# Patient Record
Sex: Female | Born: 1995 | Race: Black or African American | Hispanic: No | Marital: Single | Smoking: Never smoker
Health system: Southern US, Community
[De-identification: ages and names within clinical notes are randomized; demographics above are authoritative.]

## PROBLEM LIST (undated history)

## (undated) ENCOUNTER — Inpatient Hospital Stay (HOSPITAL_COMMUNITY): Payer: Self-pay

## (undated) DIAGNOSIS — Z789 Other specified health status: Secondary | ICD-10-CM

## (undated) DIAGNOSIS — Z8619 Personal history of other infectious and parasitic diseases: Secondary | ICD-10-CM

## (undated) DIAGNOSIS — F419 Anxiety disorder, unspecified: Secondary | ICD-10-CM

## (undated) HISTORY — DX: Personal history of other infectious and parasitic diseases: Z86.19

---

## 2017-07-22 ENCOUNTER — Encounter (HOSPITAL_COMMUNITY): Payer: Self-pay

## 2017-07-22 ENCOUNTER — Inpatient Hospital Stay (HOSPITAL_COMMUNITY): Payer: Self-pay

## 2017-07-22 ENCOUNTER — Inpatient Hospital Stay (HOSPITAL_COMMUNITY)
Admission: AD | Admit: 2017-07-22 | Discharge: 2017-07-22 | Disposition: A | Discharge status: Home / Self Care | Payer: Self-pay | Source: Ambulatory Visit | Attending: Obstetrics & Gynecology | Admitting: Obstetrics & Gynecology

## 2017-07-22 DIAGNOSIS — O00101 Right tubal pregnancy without intrauterine pregnancy: Secondary | ICD-10-CM | POA: Insufficient documentation

## 2017-07-22 DIAGNOSIS — O209 Hemorrhage in early pregnancy, unspecified: Secondary | ICD-10-CM | POA: Insufficient documentation

## 2017-07-22 DIAGNOSIS — Z3A00 Weeks of gestation of pregnancy not specified: Secondary | ICD-10-CM | POA: Insufficient documentation

## 2017-07-22 DIAGNOSIS — O00109 Unspecified tubal pregnancy without intrauterine pregnancy: Secondary | ICD-10-CM

## 2017-07-22 HISTORY — DX: Other specified health status: Z78.9

## 2017-07-22 LAB — COMPREHENSIVE METABOLIC PANEL
ALT: 13 U/L — ABNORMAL LOW (ref 14–54)
AST: 11 U/L — ABNORMAL LOW (ref 15–41)
Albumin: 4 g/dL (ref 3.5–5.0)
Alkaline Phosphatase: 54 U/L (ref 38–126)
Anion gap: 10 (ref 5–15)
BUN: 11 mg/dL (ref 6–20)
CO2: 23 mmol/L (ref 22–32)
Calcium: 9.5 mg/dL (ref 8.9–10.3)
Chloride: 102 mmol/L (ref 101–111)
Creatinine, Ser: 0.8 mg/dL (ref 0.44–1.00)
GFR calc Af Amer: >60 mL/min (ref >60)
GFR calc non Af Amer: >60 mL/min (ref >60)
Glucose, Bld: 89 mg/dL (ref 65–99)
Potassium: 3.8 mmol/L (ref 3.5–5.1)
Sodium: 135 mmol/L (ref 135–145)
Total Bilirubin: 0.7 mg/dL (ref 0.3–1.2)
Total Protein: 7.5 g/dL (ref 6.5–8.1)

## 2017-07-22 LAB — CBC
HEMATOCRIT: 35.2 % — AB (ref 36.0–46.0)
HEMOGLOBIN: 12.6 g/dL (ref 12.0–15.0)
MCH: 26.3 pg (ref 26.0–34.0)
MCHC: 35.8 g/dL (ref 30.0–36.0)
MCV: 73.3 fL — AB (ref 78.0–100.0)
PLATELETS: 512 10*3/uL — AB (ref 150–400)
RBC: 4.8 MIL/uL (ref 3.87–5.11)
RDW: 17.7 % — ABNORMAL HIGH (ref 11.5–15.5)
WBC: 6.4 10*3/uL (ref 4.0–10.5)

## 2017-07-22 LAB — URINALYSIS, ROUTINE W REFLEX MICROSCOPIC
Bacteria, UA: NONE SEEN
Bilirubin Urine: NEGATIVE
Glucose, UA: NEGATIVE mg/dL
Ketones, ur: NEGATIVE mg/dL
Leukocytes, UA: NEGATIVE
Nitrite: NEGATIVE
Protein, ur: NEGATIVE mg/dL
Specific Gravity, Urine: 1.02 (ref 1.005–1.030)
pH: 6 (ref 5.0–8.0)

## 2017-07-22 LAB — ABO/RH: ABO/RH(D): O POS

## 2017-07-22 LAB — POCT PREGNANCY, URINE: Preg Test, Ur: POSITIVE — AB

## 2017-07-22 LAB — HCG, QUANTITATIVE, PREGNANCY: hCG, Beta Chain, Quant, S: 1809 m[IU]/mL — ABNORMAL HIGH (ref <5)

## 2017-07-22 MED ORDER — METHOTREXATE INJECTION FOR WOMEN'S HOSPITAL
50.0000 mg/m2 | Freq: Once | INTRAMUSCULAR | Status: AC
  Administered 2017-07-22: 100 mg via INTRAMUSCULAR
  Filled 2017-07-22: qty 2

## 2017-07-22 NOTE — MAU Provider Note (Signed)
History     CSN: [id number]  Arrival date and time: 07/22/17 1252   First Provider Initiated Contact with Patient 07/22/17 1348      Chief Complaint  Patient presents with  . Vaginal Bleeding   HPI   [name] is a 22 y.o. female [id number] @ Unknown here in MAU with complaints of vaginal bleeding. States the bleeding started on Sunday. She was seen at the pregnancy care center today and had a pregnancy urine test that was positive. They did an [place] and told her that her baby in her uterus did not have a heartbeat and she needed to come to MAU for possible miscarriage.  States she was told that there was a baby however the baby did not have a HR.   OB History    Gravida Para Term Preterm AB Living   '2 1 1     1   ' SAB TAB Ectopic Multiple Live Births                  Past Medical History:  Diagnosis Date  . Medical history non-contributory     Past Surgical History:  Procedure Laterality Date  . CESAREAN SECTION      No family history on file.  Social History   Tobacco Use  . Smoking status: Never Smoker  . Smokeless tobacco: Never Used  Substance Use Topics  . Alcohol use: No    Frequency: Never  . Drug use: No    Allergies: Not on File  No medications prior to admission.   Results for orders placed or performed during the hospital encounter of 07/22/17 (from the past 48 hour(s))  Urinalysis, Routine w reflex microscopic     Status: Abnormal   Collection Time: 07/22/17  1:06 PM  Result Value Ref Range   Color, Urine YELLOW YELLOW   APPearance CLEAR CLEAR   Specific Gravity, Urine 1.020 1.005 - 1.030   pH 6.0 5.0 - 8.0   Glucose, UA NEGATIVE NEGATIVE mg/dL   Hgb urine dipstick LARGE (A) NEGATIVE   Bilirubin Urine NEGATIVE NEGATIVE   Ketones, ur NEGATIVE NEGATIVE mg/dL   Protein, ur NEGATIVE NEGATIVE mg/dL   Nitrite NEGATIVE NEGATIVE   Leukocytes, UA NEGATIVE NEGATIVE   RBC / HPF TOO NUMEROUS TO COUNT 0 - 5 RBC/hpf   WBC, UA 0-5 0 - 5 WBC/hpf    Bacteria, UA NONE SEEN NONE SEEN   Squamous Epithelial / LPF 0-5 (A) NONE SEEN   Mucus PRESENT   Pregnancy, urine POC     Status: Abnormal   Collection Time: 07/22/17  1:15 PM  Result Value Ref Range   Preg Test, Ur POSITIVE (A) NEGATIVE    Comment:        THE SENSITIVITY OF THIS METHODOLOGY IS >24 mIU/mL   CBC     Status: Abnormal   Collection Time: 07/22/17  1:46 PM  Result Value Ref Range   WBC 6.4 4.0 - 10.5 K/uL   RBC 4.80 3.87 - 5.11 MIL/uL   Hemoglobin 12.6 12.0 - 15.0 g/dL   HCT 35.2 (L) 36.0 - 46.0 %   MCV 73.3 (L) 78.0 - 100.0 fL   MCH 26.3 26.0 - 34.0 pg   MCHC 35.8 30.0 - 36.0 g/dL   RDW 17.7 (H) 11.5 - 15.5 %   Platelets 512 (H) 150 - 400 K/uL  ABO/Rh     Status: None   Collection Time: 07/22/17  1:46 PM  Result Value Ref Range  ABO/RH(D) O POS   hCG, quantitative, pregnancy     Status: Abnormal   Collection Time: 07/22/17  1:46 PM  Result Value Ref Range   hCG, Beta Chain, Quant, S 1,809 (H) <5 mIU/mL    Comment:          GEST. AGE      CONC.  (mIU/mL)   <=1 WEEK        5 - 50     2 WEEKS       50 - 500     3 WEEKS       100 - 10,000     4 WEEKS     1,000 - 30,000     5 WEEKS     3,500 - 115,000   6-8 WEEKS     12,000 - 270,000    12 WEEKS     15,000 - 220,000        FEMALE AND NON-PREGNANT FEMALE:     LESS THAN 5 mIU/mL   Comprehensive metabolic panel     Status: Abnormal   Collection Time: 07/22/17  1:46 PM  Result Value Ref Range   Sodium 135 135 - 145 mmol/L   Potassium 3.8 3.5 - 5.1 mmol/L   Chloride 102 101 - 111 mmol/L   CO2 23 22 - 32 mmol/L   Glucose, Bld 89 65 - 99 mg/dL   BUN 11 6 - 20 mg/dL   Creatinine, Ser 0.80 0.44 - 1.00 mg/dL   Calcium 9.5 8.9 - 10.3 mg/dL   Total Protein 7.5 6.5 - 8.1 g/dL   Albumin 4.0 3.5 - 5.0 g/dL   AST 11 (L) 15 - 41 U/L   ALT 13 (L) 14 - 54 U/L   Alkaline Phosphatase 54 38 - 126 U/L   Total Bilirubin 0.7 0.3 - 1.2 mg/dL   GFR calc non Af Amer >60 >60 mL/min   GFR calc Af Amer >60 >60 mL/min     Comment: (NOTE) The eGFR has been calculated using the CKD EPI equation. This calculation has not been validated in all clinical situations. eGFR's persistently <60 mL/min signify possible Chronic Kidney Disease.    Anion gap 10 5 - 15   US Ob Comp Less 14 Wks  Result Date: 07/22/2017 CLINICAL DATA:  Heavy vaginal bleeding. Unknown LMP. Approximately 4 months postpartum. EXAM: OBSTETRIC <14 WK [place] AND TRANSVAGINAL OB US TECHNIQUE: Both transabdominal and transvaginal ultrasound examinations were performed for complete evaluation of the gestation as well as the maternal uterus, adnexal regions, and pelvic cul-de-sac. Transvaginal technique was performed to assess early pregnancy. COMPARISON:  None. FINDINGS: Intrauterine gestational sac: None visualized Maternal uterus/adnexae: No fibroids identified. Normal appearance of both ovaries. In the right adnexa, separate from the ovary, is a cystic structure which appears to represent a gestational sac. This measures 5.6 x 3.3 x 3.5 cm, and appears to contain a yolk sac as well as an embryo. Embryo measures 12 mm in crown-rump length, but no embryonic cardiac activity is visualized. No other adnexal masses are seen and there is no evidence of hemoperitoneum. IMPRESSION: No IUP visualized. Gestational sac containing an embryo in the right adnexa, consistent with ectopic pregnancy. No embryonic cardiac activity visualized. No evidence of hemoperitoneum. Critical Value/emergent results were called by telephone at the time of interpretation on 07/22/2017 at 3:24 pm to Dr. Anderson [facility] , who verbally acknowledged these results. Electronically Signed   By: Earle Gell M.D.   On: 07/22/2017 15:25  US Ob Transvaginal  Result Date: 07/22/2017 CLINICAL DATA:  Heavy vaginal bleeding. Unknown LMP. Approximately 4 months postpartum. EXAM: OBSTETRIC <14 WK [place] AND TRANSVAGINAL OB US TECHNIQUE: Both transabdominal and transvaginal ultrasound examinations were performed for  complete evaluation of the gestation as well as the maternal uterus, adnexal regions, and pelvic cul-de-sac. Transvaginal technique was performed to assess early pregnancy. COMPARISON:  None. FINDINGS: Intrauterine gestational sac: None visualized Maternal uterus/adnexae: No fibroids identified. Normal appearance of both ovaries. In the right adnexa, separate from the ovary, is a cystic structure which appears to represent a gestational sac. This measures 5.6 x 3.3 x 3.5 cm, and appears to contain a yolk sac as well as an embryo. Embryo measures 12 mm in crown-rump length, but no embryonic cardiac activity is visualized. No other adnexal masses are seen and there is no evidence of hemoperitoneum. IMPRESSION: No IUP visualized. Gestational sac containing an embryo in the right adnexa, consistent with ectopic pregnancy. No embryonic cardiac activity visualized. No evidence of hemoperitoneum. Critical Value/emergent results were called by telephone at the time of interpretation on 07/22/2017 at 3:24 pm to Dr. Anderson [facility] , who verbally acknowledged these results. Electronically Signed   By: Earle Gell M.D.   On: 07/22/2017 15:25   Review of Systems  Constitutional: Negative for fever.  Gastrointestinal: Negative for abdominal pain.  Genitourinary: Positive for vaginal discharge.   Physical Exam   Blood pressure 126/71, temperature 98.1 F (36.7 C), temperature source Oral, resp. rate 16, weight 199 lb (90.3 kg), last menstrual period 04/23/2017, SpO2 100 %.  Physical Exam  Constitutional: She is oriented to person, place, and time. She appears well-developed and well-nourished. No distress.  HENT:  Head: Normocephalic.  Eyes: Pupils are equal, round, and reactive to light.  Neck: Neck supple.  GI: Soft. She exhibits no distension. There is no tenderness. There is no rebound and no guarding.  Genitourinary:  Genitourinary Comments: Small amount of bright red blood noted on the patient's pad.    Neurological: She is alert and oriented to person, place, and time.  Skin: Skin is warm. She is not diaphoretic.  Psychiatric: Her behavior is normal.   MAU Course  Procedures  None  MDM  [place] ordered O positive blood type  Discussed [place] in detail with the patient. Offered MTX and patient is agreeable. Discussed with Dr. Elly Modena. MTX given.    Assessment and Plan   A:  1. Tubal pregnancy without intrauterine pregnancy, right   2. Vaginal bleeding in pregnancy, first trimester     P:  Discharge home with strict ectopic precautions Pelvic rest Return to MAU if symptoms worsen  Bleeding precautions Stop prenatal vitamins Return Saturday for Day 4 Hcg level Support given   Noni Saupe I, NP 07/22/2017 7:51 PM

## 2017-07-22 NOTE — Discharge Instructions (Signed)
Methotrexate Treatment for an Ectopic Pregnancy °Methotrexate is a medicine that treats a pregnancy condition in which the fetus develops outside the uterus (ectopic pregnancy) by stopping the growth of the fertilized egg. It also helps your body absorb tissue from the egg. This takes between 2 weeks and 6 weeks. Most ectopic pregnancies can be successfully treated with methotrexate if they are detected early enough. °Tell a health care provider about: °· Any allergies you have. °· All medicines you are taking, including vitamins, herbs, eye drops, creams, and over-the-counter medicines. °· Any medical conditions you have. °What are the risks? °Generally, this is a safe treatment. However, problems can occur, including: °· Nausea. °· Vomiting. °· Diarrhea. °· Abdominal cramping. °· Mouth sores. °· Increased vaginal bleeding or spotting. °· Swelling or irritation of the lining of your lungs (pneumonitis). °· Failed treatment and continuation of the pregnancy. °· Liver damage. °· Hair loss. ° °There is still a risk of the ectopic pregnancy rupturing while using the methotrexate. °What happens before the procedure? °Before you take the medicine: °· Liver tests, kidney tests, and a complete blood test are performed. °· Blood tests are performed to measure the pregnancy hormone levels and to determine your blood type. °· If you are Rh-negative and the father is Rh-positive or his Rh type is not known, you will be given a Rho (D) immune globulin shot. ° °What happens during the procedure? °There are two methods that your health care provider may use to give you methotrexate. °· One method involves a single dose or injection of the medicine. °· Another method involves a series of doses given through several injections. ° °What happens after the procedure? °· You may have some abdominal cramping, vaginal bleeding, and fatigue in the first few days after taking methotrexate. °· Blood tests will be taken for several weeks to  check the pregnancy hormone levels. The blood tests are performed until there is no more pregnancy hormone detected in the blood. °This information is not intended to replace advice given to you by your health care provider. Make sure you discuss any questions you have with your health care provider. °Document Released: 06/03/2001 Document Revised: 11/15/2015 Document Reviewed: 03/28/2013 °Elsevier Interactive Patient Education © 2017 [employer] ° °

## 2017-07-22 NOTE — MAU Note (Signed)
Pt was at the Pregnancy care center today to confirm pregnancy and get an ultrasound. No FHR was seen on ultrasound. Pt also said she has been having some heavier bleeding with clots. Unsure of LMP because she had a baby 4 months ago.

## 2017-07-23 LAB — HIV ANTIBODY (ROUTINE TESTING W REFLEX): HIV Screen 4th Generation wRfx: NONREACTIVE

## 2018-02-17 ENCOUNTER — Other Ambulatory Visit: Payer: Self-pay

## 2018-02-17 ENCOUNTER — Encounter (HOSPITAL_BASED_OUTPATIENT_CLINIC_OR_DEPARTMENT_OTHER): Payer: Self-pay | Admitting: Emergency Medicine

## 2018-02-17 ENCOUNTER — Emergency Department (HOSPITAL_BASED_OUTPATIENT_CLINIC_OR_DEPARTMENT_OTHER)
Admission: EM | Admit: 2018-02-17 | Discharge: 2018-02-17 | Disposition: A | Discharge status: Home / Self Care | Payer: Self-pay | Attending: Emergency Medicine | Admitting: Emergency Medicine

## 2018-02-17 DIAGNOSIS — O98319 Other infections with a predominantly sexual mode of transmission complicating pregnancy, unspecified trimester: Secondary | ICD-10-CM | POA: Insufficient documentation

## 2018-02-17 DIAGNOSIS — O4690 Antepartum hemorrhage, unspecified, unspecified trimester: Secondary | ICD-10-CM

## 2018-02-17 DIAGNOSIS — Z3A01 Less than 8 weeks gestation of pregnancy: Secondary | ICD-10-CM | POA: Insufficient documentation

## 2018-02-17 DIAGNOSIS — O209 Hemorrhage in early pregnancy, unspecified: Secondary | ICD-10-CM | POA: Insufficient documentation

## 2018-02-17 DIAGNOSIS — Z79899 Other long term (current) drug therapy: Secondary | ICD-10-CM | POA: Insufficient documentation

## 2018-02-17 DIAGNOSIS — A599 Trichomoniasis, unspecified: Secondary | ICD-10-CM

## 2018-02-17 LAB — URINALYSIS, ROUTINE W REFLEX MICROSCOPIC
BILIRUBIN URINE: NEGATIVE
GLUCOSE, UA: NEGATIVE mg/dL
Ketones, ur: NEGATIVE mg/dL
Nitrite: NEGATIVE
Protein, ur: NEGATIVE mg/dL
Specific Gravity, Urine: <1.005 — ABNORMAL LOW (ref 1.005–1.030)
pH: 6 (ref 5.0–8.0)

## 2018-02-17 LAB — WET PREP, GENITAL
CLUE CELLS WET PREP: NONE SEEN
Sperm: NONE SEEN
YEAST WET PREP: NONE SEEN

## 2018-02-17 LAB — URINALYSIS, MICROSCOPIC (REFLEX)

## 2018-02-17 LAB — PREGNANCY, URINE: Preg Test, Ur: POSITIVE — AB

## 2018-02-17 MED ORDER — CEFTRIAXONE SODIUM 250 MG IJ SOLR
250.0000 mg | Freq: Once | INTRAMUSCULAR | Status: AC
  Administered 2018-02-17: 250 mg via INTRAMUSCULAR
  Filled 2018-02-17: qty 250

## 2018-02-17 MED ORDER — AZITHROMYCIN 250 MG PO TABS
1000.0000 mg | ORAL_TABLET | Freq: Once | ORAL | Status: AC
  Administered 2018-02-17: 1000 mg via ORAL
  Filled 2018-02-17: qty 4

## 2018-02-17 MED ORDER — METRONIDAZOLE 500 MG PO TABS
2000.0000 mg | ORAL_TABLET | Freq: Once | ORAL | Status: AC
  Administered 2018-02-17: 2000 mg via ORAL
  Filled 2018-02-17: qty 4

## 2018-02-17 MED ORDER — FOSFOMYCIN TROMETHAMINE 3 G PO PACK
3.0000 g | PACK | Freq: Once | ORAL | Status: AC
  Administered 2018-02-17: 3 g via ORAL
  Filled 2018-02-17: qty 3

## 2018-02-17 NOTE — ED Provider Notes (Signed)
[facility] EMERGENCY DEPARTMENT Provider Note   CSN: [id number] Arrival date & time: 02/17/18  0802     History   Chief Complaint Chief Complaint  Patient presents with  . Vaginal Bleeding    HPI Leslie Barrett is a 22 y.o. female.  22 yo F with a chief complaint of vaginal spotting.  This happened earlier today and is resolved.  Patient denies any cramping denies any abdominal pain denies nausea or vomiting.  Patient was recently pregnant by multiple home pregnancy test.  Has not yet established with her primary care provider.  She is going to go to the [place] pregnancy center to obtain a verification of pregnancy and then apply for Medicaid and follow-up with an OB/GYN.  She had some spotting today and so came to the ED.  She is been pregnant 3 times.  She has a daughter and had an ectopic pregnancy that resolved with therapy.  The history is provided by the patient.  Vaginal Bleeding  Primary symptoms include vaginal bleeding.  Primary symptoms include no dysuria. There has been no fever. This is a new problem. The current episode started yesterday. The problem occurs rarely. The problem has been resolved. Her LMP was months ago. The patient's menstrual history has been regular. The discharge was bloody. Associated symptoms include constipation. Pertinent negatives include no nausea, no vomiting and no dizziness. She has tried nothing for the symptoms. The treatment provided no relief. Sexual activity: sexually active. There is no concern regarding sexually transmitted diseases. She uses nothing for contraception.    Past Medical History:  Diagnosis Date  . Medical history non-contributory     There are no active problems to display for this patient.   Past Surgical History:  Procedure Laterality Date  . CESAREAN SECTION       OB History    Gravida  3   Para  1   Term  1   Preterm      AB      Living  1     SAB      TAB      Ectopic        Multiple      Live Births               Home Medications    Prior to Admission medications   Medication Sig Start Date End Date Taking? Authorizing Provider  sertraline (ZOLOFT) 50 MG tablet Take 50 mg by mouth daily.    [provider]    Family History No family history on file.  Social History Social History   Tobacco Use  . Smoking status: Never Smoker  . Smokeless tobacco: Never Used  Substance Use Topics  . Alcohol use: Yes    Frequency: Never    Comment: 1-2 times per week - wine  . Drug use: No     Allergies   Patient has no known allergies.   Review of Systems Review of Systems  Constitutional: Negative for chills and fever.  HENT: Negative for congestion and rhinorrhea.   Eyes: Negative for redness and visual disturbance.  Respiratory: Negative for shortness of breath and wheezing.   Cardiovascular: Negative for chest pain and palpitations.  Gastrointestinal: Positive for constipation. Negative for nausea and vomiting.  Genitourinary: Positive for vaginal bleeding. Negative for dysuria, urgency, vaginal discharge and vaginal pain.  Musculoskeletal: Negative for arthralgias and myalgias.  Skin: Negative for pallor and wound.  Neurological: Negative for dizziness and headaches.  Physical Exam Updated Vital Signs BP (!) 148/77 (BP Location: Right Arm)   Pulse (!) 104   Temp 98.6 F (37 C) (Oral)   Resp 16   Ht 5\' 4"  (1.626 m)   Wt 74.8 kg   LMP 01/17/2018   SpO2 97%   Breastfeeding? Unknown   BMI 28.32 kg/m   Physical Exam  Constitutional: She is oriented to person, place, and time. She appears well-developed and well-nourished. No distress.  HENT:  Head: Normocephalic and atraumatic.  Eyes: Pupils are equal, round, and reactive to light. EOM are normal.  Neck: Normal range of motion. Neck supple.  Cardiovascular: Normal rate and regular rhythm. Exam reveals no gallop and no friction rub.  No murmur  heard. Pulmonary/Chest: Effort normal. She has no wheezes. She has no rales.  Abdominal: Soft. She exhibits no distension. There is no tenderness.  Genitourinary:  Genitourinary Comments: Strawberry cervix.  No adnexal tenderness.  Mucousy discharge.  Musculoskeletal: She exhibits no edema or tenderness.  Neurological: She is alert and oriented to person, place, and time.  Skin: Skin is warm and dry. She is not diaphoretic.  Psychiatric: She has a normal mood and affect. Her behavior is normal.  Nursing note and vitals reviewed.    ED Treatments / Results  Labs (all labs ordered are listed, but only abnormal results are displayed) Labs Reviewed  PREGNANCY, URINE - Abnormal; Notable for the following components:      Result Value   Preg Test, Ur POSITIVE (*)    All other components within normal limits  URINALYSIS, ROUTINE W REFLEX MICROSCOPIC - Abnormal; Notable for the following components:   Specific Gravity, Urine <1.005 (*)    Hgb urine dipstick LARGE (*)    Leukocytes, UA MODERATE (*)    All other components within normal limits  URINALYSIS, MICROSCOPIC (REFLEX) - Abnormal; Notable for the following components:   Bacteria, UA MANY (*)    Trichomonas, UA PRESENT (*)    All other components within normal limits  WET PREP, GENITAL  GC/CHLAMYDIA PROBE AMP ([place]) NOT AT [facility]    EKG None  Radiology No results found.  Procedures Procedures (including critical care time) EMERGENCY DEPARTMENT [place] PREGNANCY "Study: Limited Ultrasound of the Pelvis"  INDICATIONS:Pregnancy(required) and Vaginal bleeding Multiple views of the uterus and pelvic cavity are obtained with a multi-frequency probe.  APPROACH:Transabdominal  and Transvaginal  PERFORMED BY: Myself  IMAGES ARCHIVED?: Yes  LIMITATIONS: Decompressed bladder  PREGNANCY FREE FLUID: Present  PREGNANCY UTERUS FINDINGS:Uterus enlarged and Gestational sac noted ADNEXAL FINDINGS:Right ovarion cyst  PREGNANCY  FINDINGS: Intrauterine gestational sac noted, No yolk sac noted and No fetal pole seen  INTERPRETATION: Intrauterine gestational sac noted  GESTATIONAL AGE, ESTIMATE: 4wk 4 days, 2 small sacs intrauterine possible multipara, however one is measuring at about 4 weeks and 4 days and the other is too small for estimating gestational age.  Medications Ordered in ED Medications  metroNIDAZOLE (FLAGYL) tablet 2,000 mg (has no administration in time range)  cefTRIAXone (ROCEPHIN) injection 250 mg (has no administration in time range)  azithromycin (ZITHROMAX) tablet 1,000 mg (has no administration in time range)  fosfomycin (MONUROL) packet 3 g (has no administration in time range)     Initial Impression / Assessment and Plan / ED Course  I have reviewed the triage vital signs and the nursing notes.  Pertinent labs & imaging results that were available during my care of the patient were reviewed by me and considered in my medical decision  making (see chart for details).     22 yo F with a chief complaint of vaginal spotting.  This has resolved.  My exam the patient has findings consistent with trichomoniasis.  This was seen also in her urine.  Will treat presumptively for STDs.  She has 6-10 whites and 2 numerous to count bacteria, will treat as possible urinary tract infection though this may be related to the trichomoniasis.  Suggested the patient follow-up with OB/GYN.  Bedside ultrasound without any concerning findings in the adnexa, there were 2 small foci mid uterus, possible multiple pregnancy, though very early to tell.  9:36 AM:  I have discussed the diagnosis/risks/treatment options with the patient and believe the pt to be eligible for discharge home to follow-up with OB. We also discussed returning to the ED immediately if new or worsening sx occur. We discussed the sx which are most concerning (e.g., sudden worsening pain, fever, inability to tolerate by mouth, vaginal bleeding) that  necessitate immediate return. Medications administered to the patient during their visit and any new prescriptions provided to the patient are listed below.  Medications given during this visit Medications  metroNIDAZOLE (FLAGYL) tablet 2,000 mg (has no administration in time range)  cefTRIAXone (ROCEPHIN) injection 250 mg (has no administration in time range)  azithromycin (ZITHROMAX) tablet 1,000 mg (has no administration in time range)  fosfomycin (MONUROL) packet 3 g (has no administration in time range)      The patient appears reasonably screen and/or stabilized for discharge and I doubt any other medical condition or other [facility] requiring further screening, evaluation, or treatment in the ED at this time prior to discharge.    Final Clinical Impressions(s) / ED Diagnoses   Final diagnoses:  Vaginal bleeding in pregnancy  Trichomoniasis    ED Discharge Orders    None       Melene PlanFloyd, Ermelinda Eckert, DO 02/17/18 [phone]

## 2018-02-17 NOTE — ED Notes (Signed)
ED Provider at bedside. 

## 2018-02-17 NOTE — Discharge Instructions (Signed)
No sexual activity for one week.  Follow up with your OBGYN.  Your partner likely needs to be treated for trichomonas as well or you will keep getting infected.  Return for worsening pelvic pain, vaginal bleeding.

## 2018-02-17 NOTE — ED Triage Notes (Signed)
Pt noticed pink discharge in her underwear this morning at work. Has done multiple preg tests that were positive.  Has appt Thursday. Denies pain.

## 2018-02-19 LAB — GC/CHLAMYDIA PROBE AMP (~~LOC~~) NOT AT ARMC
CHLAMYDIA, DNA PROBE: NEGATIVE
Neisseria Gonorrhea: NEGATIVE

## 2018-03-29 ENCOUNTER — Emergency Department (HOSPITAL_COMMUNITY)
Admission: EM | Admit: 2018-03-29 | Discharge: 2018-03-29 | Disposition: A | Discharge status: Home / Self Care | Payer: Self-pay | Attending: Emergency Medicine | Admitting: Emergency Medicine

## 2018-03-29 ENCOUNTER — Other Ambulatory Visit: Payer: Self-pay

## 2018-03-29 ENCOUNTER — Encounter (HOSPITAL_COMMUNITY): Payer: Self-pay | Admitting: Emergency Medicine

## 2018-03-29 DIAGNOSIS — J111 Influenza due to unidentified influenza virus with other respiratory manifestations: Secondary | ICD-10-CM | POA: Insufficient documentation

## 2018-03-29 DIAGNOSIS — R69 Illness, unspecified: Secondary | ICD-10-CM

## 2018-03-29 DIAGNOSIS — Z79899 Other long term (current) drug therapy: Secondary | ICD-10-CM | POA: Insufficient documentation

## 2018-03-29 MED ORDER — IBUPROFEN 200 MG PO TABS
600.0000 mg | ORAL_TABLET | Freq: Once | ORAL | Status: AC
  Administered 2018-03-29: 600 mg via ORAL
  Filled 2018-03-29: qty 3

## 2018-03-29 NOTE — ED Triage Notes (Signed)
Pt complaint diarrhea, cough, and body aches.

## 2018-03-29 NOTE — Discharge Instructions (Signed)
You appear to have the flu.  Be sure to take ibuprofen and Tylenol for your body aches and fever.  If you develop shortness of breath, vomiting, severe headache or neck stiffness, or any other new/concerning symptoms and need to return to the ER for evaluation.  Otherwise follow-up with a primary care physician.

## 2018-03-29 NOTE — ED Provider Notes (Addendum)
[facility]   CSN: [id number] Arrival date & time: 03/29/18  1428     History   Chief Complaint Chief Complaint  Patient presents with  . Flu Like Illness    HPI Leslie Barrett is a 22 y.o. female.  HPI  22 year old female presents with concern for the flu.  She states is been going around at her daughter's school and now she is been feeling sick since 2 days ago.  She is been having subjective hot and cold chills with no documented fever.  Is having body aches, headache, sore throat, congestion, nonproductive cough.  She denies any neck stiffness, vomiting, abdominal pain, shortness of breath.  She is having some diarrhea.  She is able to drink but is not having much appetite.  She took some DayQuil.  Daughter has been recently sick and is currently using albuterol although the patient has not had any shortness of breath or wheezing.  A couple months ago patient was pregnant but then had a miscarriage shortly after being seen in the ED for vaginal bleeding.  Did not receive a flu shot.  Past Medical History:  Diagnosis Date  . Medical history non-contributory     There are no active problems to display for this patient.   Past Surgical History:  Procedure Laterality Date  . CESAREAN SECTION       OB History    Gravida  3   Para  1   Term  1   Preterm      AB      Living  1     SAB      TAB      Ectopic      Multiple      Live Births               Home Medications    Prior to Admission medications   Medication Sig Start Date End Date Taking? Authorizing Provider  sertraline (ZOLOFT) 50 MG tablet Take 50 mg by mouth daily.    [provider]    Family History No family history on file.  Social History Social History   Tobacco Use  . Smoking status: Never Smoker  . Smokeless tobacco: Never Used  Substance Use Topics  . Alcohol use: Yes    Frequency: Never    Comment: 1-2 times  per week - wine  . Drug use: No     Allergies   Patient has no known allergies.   Review of Systems Review of Systems  Constitutional: Positive for chills and fever (subjective).  HENT: Positive for congestion and sore throat.   Respiratory: Positive for cough. Negative for shortness of breath.   Cardiovascular: Negative for chest pain.  Gastrointestinal: Positive for diarrhea. Negative for abdominal pain and vomiting.  Genitourinary: Negative for dysuria.  Musculoskeletal: Negative for neck stiffness.  Neurological: Positive for headaches.  All other systems reviewed and are negative.    Physical Exam Updated Vital Signs BP 121/74 (BP Location: Left Arm)   Pulse (!) 106   Temp 99.8 F (37.7 C) (Oral)   Resp 16   LMP 03/23/2018   SpO2 100%   Physical Exam  Constitutional: She appears well-developed and well-nourished. No distress.  HENT:  Head: Normocephalic and atraumatic.  Right Ear: External ear normal.  Left Ear: External ear normal.  Nose: Nose normal.  Mouth/Throat: Uvula is midline and mucous membranes are normal. Posterior oropharyngeal erythema (mild) present. No oropharyngeal exudate,  posterior oropharyngeal edema or tonsillar abscesses.  Eyes: Pupils are equal, round, and reactive to light. EOM are normal. Right eye exhibits no discharge. Left eye exhibits no discharge.  Neck: Normal range of motion. Neck supple.  Cardiovascular: Regular rhythm and normal heart sounds. Tachycardia present.  HR ~105  Pulmonary/Chest: Effort normal and breath sounds normal. She has no wheezes. She has no rales.  Abdominal: Soft. She exhibits no distension. There is no tenderness.  Neurological: She is alert.  Skin: Skin is warm and dry. She is not diaphoretic.  Psychiatric: Her mood appears not anxious.  Nursing note and vitals reviewed.    ED Treatments / Results  Labs (all labs ordered are listed, but only abnormal results are displayed) Labs Reviewed - No data to  display  EKG None  Radiology No results found.  Procedures Procedures (including critical care time)  Medications Ordered in ED Medications  ibuprofen (ADVIL,MOTRIN) tablet 600 mg (has no administration in time range)     Initial Impression / Assessment and Plan / ED Course  I have reviewed the triage vital signs and the nursing notes.  Pertinent labs & imaging results that were available during my care of the patient were reviewed by me and considered in my medical decision making (see chart for details).     Presentation is consistent with influenza.  However she has no significant past medical history or findings on history or exam that would suggest more severe illness in need of Tamiflu.  Discussed risks and benefits of Tamiflu and she declines it at this time which I think is reasonable.  We discussed supportive care such as oral fluids, ibuprofen, Tylenol.  She has a little bit of dry cough but I think this is from the flu and her lungs are clear with no increased work of breathing or hypoxia.  Highly doubt pneumonia.  Does not appear significantly dehydrated or in need of fluids or labs. We discussed expected course and discussed strict return precautions.  Final Clinical Impressions(s) / ED Diagnoses   Final diagnoses:  Influenza-like illness    ED Discharge Orders    None       Pricilla Loveless, MD 03/29/18 1615    Pricilla Loveless, MD 03/29/18 1616

## 2018-07-14 ENCOUNTER — Other Ambulatory Visit: Payer: Self-pay

## 2018-07-14 ENCOUNTER — Emergency Department (HOSPITAL_COMMUNITY)
Admission: EM | Admit: 2018-07-14 | Discharge: 2018-07-14 | Disposition: A | Discharge status: Home / Self Care | Payer: Self-pay | Attending: Emergency Medicine | Admitting: Emergency Medicine

## 2018-07-14 ENCOUNTER — Emergency Department (HOSPITAL_COMMUNITY): Payer: Self-pay

## 2018-07-14 DIAGNOSIS — Z79899 Other long term (current) drug therapy: Secondary | ICD-10-CM | POA: Insufficient documentation

## 2018-07-14 DIAGNOSIS — B9789 Other viral agents as the cause of diseases classified elsewhere: Secondary | ICD-10-CM | POA: Insufficient documentation

## 2018-07-14 DIAGNOSIS — J069 Acute upper respiratory infection, unspecified: Secondary | ICD-10-CM | POA: Insufficient documentation

## 2018-07-14 LAB — POC URINE PREG, ED: PREG TEST UR: NEGATIVE

## 2018-07-14 MED ORDER — ACETAMINOPHEN 500 MG PO TABS
1000.0000 mg | ORAL_TABLET | Freq: Once | ORAL | Status: AC
  Administered 2018-07-14: 1000 mg via ORAL
  Filled 2018-07-14: qty 2

## 2018-07-14 MED ORDER — IBUPROFEN 600 MG PO TABS: 600.0000 mg | ORAL_TABLET | Freq: Four times a day (QID) | ORAL | 0 refills | Status: DC | PRN

## 2018-07-14 NOTE — ED Provider Notes (Signed)
[facility] Provider Note   CSN: [id number] Arrival date & time: 07/14/18  5498     History   Chief Complaint Chief Complaint  Patient presents with  . URI    HPI Leslie Barrett is a 23 y.o. female.  HPI  Patient is a 23 year old female with no significant past medical history presenting for 2 to 3 days of productive cough, myalgias, and nasal congestion.  Patient reports having chills and sweats at home but denies taking her temperature.  She reports congestion, but denies any sore throat.  Cough productive of green sputum without hemoptysis.  Patient denies any history DVT/PE, recent mobilization, hospitalization, hormone use, cancer treatment, recent surgery.  Patient has tried over-the-counter Coricidin for her symptoms without relief.  No known sick contacts, but patient does report that she has a 43-year-old who goes to daycare.  No history of lung disease or immune compromise status.  Patient did not receive flu shot this year.  Past Medical History:  Diagnosis Date  . Medical history non-contributory     There are no active problems to display for this patient.   Past Surgical History:  Procedure Laterality Date  . CESAREAN SECTION       OB History    Gravida  3   Para  1   Term  1   Preterm      AB      Living  1     SAB      TAB      Ectopic      Multiple      Live Births               Home Medications    Prior to Admission medications   Medication Sig Start Date End Date Taking? Authorizing Provider  sertraline (ZOLOFT) 50 MG tablet Take 50 mg by mouth daily.    [provider]    Family History No family history on file.  Social History Social History   Tobacco Use  . Smoking status: Never Smoker  . Smokeless tobacco: Never Used  Substance Use Topics  . Alcohol use: Yes    Frequency: Never    Comment: 1-2 times per week - wine  . Drug use: No     Allergies   Patient has no  known allergies.   Review of Systems Review of Systems  Constitutional: Positive for chills and fever.  HENT: Positive for congestion and rhinorrhea. Negative for sore throat.   Respiratory: Positive for cough. Negative for shortness of breath.   Gastrointestinal: Negative for abdominal pain, nausea and vomiting.     Physical Exam Updated Vital Signs BP 139/86 (BP Location: Right Arm)   Pulse (!) 115   Temp (!) 100.4 F (38 C) (Oral)   Resp 18   Ht 5\' 4"  (1.626 m)   Wt 98 kg   LMP 07/03/2018 (Approximate)   SpO2 100%   BMI 37.08 kg/m   Physical Exam Vitals signs and nursing note reviewed.  Constitutional:      General: She is not in acute distress.    Appearance: She is well-developed. She is not diaphoretic.     Comments: Sitting comfortably in bed.  HENT:     Head: Normocephalic and atraumatic.     Right Ear: Tympanic membrane normal.     Left Ear: Tympanic membrane normal.     Mouth/Throat:     Mouth: Mucous membranes are moist.     Pharynx:  No oropharyngeal exudate or posterior oropharyngeal erythema.  Eyes:     General:        Right eye: No discharge.        Left eye: No discharge.     Conjunctiva/sclera: Conjunctivae normal.     Comments: EOMs normal to gross examination.  Neck:     Musculoskeletal: Normal range of motion.  Cardiovascular:     Rate and Rhythm: Normal rate and regular rhythm.     Heart sounds: Normal heart sounds.  Pulmonary:     Effort: Pulmonary effort is normal.     Breath sounds: Normal breath sounds. No wheezing or rales.  Abdominal:     General: There is no distension.  Musculoskeletal: Normal range of motion.  Skin:    General: Skin is warm and dry.  Neurological:     Mental Status: She is alert.     Comments: Cranial nerves intact to gross observation. Patient moves extremities without difficulty.  Psychiatric:        Behavior: Behavior normal.        Thought Content: Thought content normal.        Judgment: Judgment  normal.      ED Treatments / Results  Labs (all labs ordered are listed, but only abnormal results are displayed) Labs Reviewed  POC URINE PREG, ED    EKG None  Radiology No results found.  Procedures Procedures (including critical care time)  Medications Ordered in ED Medications  acetaminophen (TYLENOL) tablet 1,000 mg (1,000 mg Oral Given 07/14/18 [id number])     Initial Impression / Assessment and Plan / ED Course  I have reviewed the triage vital signs and the nursing notes.  Pertinent labs & imaging results that were available during my care of the patient were reviewed by me and considered in my medical decision making (see chart for details).     Patient with symptoms consistent with a viral syndrome. Vitals are stable, no fever. No signs of dehydration. Lung exam normal, no signs of pneumonia, and chest x-ray, reviewed by me without cardiopulmonary disease.  Do not suspect PE.  Influenza possible, however given well appearance, and 3 days into symptoms, and no comorbid conditions that warrant antiviral treatment, will defer testing.  Supportive therapy indicated with return if symptoms worsen.    Final Clinical Impressions(s) / ED Diagnoses   Final diagnoses:  Viral URI with cough    ED Discharge Orders         Ordered    ibuprofen (ADVIL,MOTRIN) 600 MG tablet  Every 6 hours PRN     07/14/18 1046           Elisha PonderMurray, Tierney Behl B, [place]PA-C 07/14/18 1047    Sabas SousBero, Michael M, MD 07/15/18 [phone]

## 2018-07-14 NOTE — ED Notes (Signed)
Pt d/c home per MD order. Discharge summary reviewed, pt verbalizes understanding. RX provided. Pt signed e-signature. Ambulatory off unit. No s/s of distress.

## 2018-07-14 NOTE — ED Triage Notes (Addendum)
Pt A&O. Pt c/o cough and congestion in chest. Pt reports coughing up green film over the past 2 days. Pt has been taking OTC cold medication without relief. C/O generalized body weakness.

## 2018-07-14 NOTE — ED Notes (Signed)
This RN present during [place], RN's triage and agrees with her assessment

## 2018-07-14 NOTE — Discharge Instructions (Signed)
Please read and follow all provided instructions.  Your diagnoses today include:  1. Viral URI with cough     You appear to have an upper respiratory infection (URI). An upper respiratory tract infection, or cold, is a viral infection of the air passages leading to the lungs. It should improve gradually after 5-7 days. You may have a lingering cough that lasts for 2- 4 weeks after the infection.  Tests performed today include: Vital signs. See below for your results today.   Medications prescribed:   Take any prescribed medications only as directed. Treatment for your infection is aimed at treating the symptoms. There are no medications, such as antibiotics, that will cure your infection.   I recommend Mucinex DM.  The generic is guaifenesin-dextromethorphan.  This is an over-the-counter medication.  Please take this twice daily for at least 5 days.  You are prescribed ibuprofen, a non-steroidal anti-inflammatory agent (NSAID) for pain. You may take 600 mg every 6 hours as needed for pain. If still requiring this medication around the clock for acute pain after 10 days, please see your primary healthcare provider.  Women who are pregnant, breastfeeding, or planning on becoming pregnant should not take non-steroidal anti-inflammatories such as Advil and Aleve. Tylenol is a safe over the counter pain reliever in pregnant women.  You may combine this medication with Tylenol, 650 mg every 6 hours, so you are receiving something for pain every 3 hours.  This is not a long-term medication unless under the care and direction of your primary provider. Taking this medication long-term and not under the supervision of a healthcare provider could increase the risk of stomach ulcers, kidney problems, and cardiovascular problems such as high blood pressure.   Home care instructions:  Follow any educational materials contained in this packet.   Your illness is contagious and can be spread to others,  especially during the first 3 or 4 days. It cannot be cured by antibiotics or other medicines. Take basic precautions such as washing your hands often, covering your mouth when you cough or sneeze, and avoiding public places where you could spread your illness to others.   Please continue drinking plenty of fluids.  Use over-the-counter medicines as needed as directed on packaging for symptom relief.  You may also use ibuprofen or tylenol as directed on packaging for pain or fever.  Do not take multiple medicines containing Tylenol or acetaminophen to avoid taking too much of this medication.  Follow-up instructions: Please follow-up with your primary care provider in the next 3 days for further evaluation of your symptoms if you are not feeling better.   Return instructions:  Please return to the Emergency Department if you experience worsening symptoms.  RETURN IMMEDIATELY IF you develop shortness of breath, chest pain, confusion or altered mental status, a new rash, become dizzy, faint, or poorly responsive, or are unable to be cared for at home. Please return if you have persistent vomiting and cannot keep down fluids or develop a fever that is not controlled by tylenol or motrin.   Please return if you have any other emergent concerns.  Additional Information:  You may only return to work once you are fever free for 24 hours not requiring ibuprofen or acetaminophen.  (When chills resolve).   Your vital signs today were: BP 112/71 (BP Location: Right Arm)    Pulse 88    Temp 99.7 F (37.6 C) (Oral)    Resp 16    Ht 5\' 4"  (  1.626 m)    Wt 98 kg    LMP 07/03/2018 (Approximate)    SpO2 98%    BMI 37.08 kg/m  If your blood pressure (BP) was elevated above 135/85 this visit, please have this repeated by your doctor within one month. --------------

## 2018-09-15 ENCOUNTER — Encounter: Payer: Self-pay | Admitting: Certified Nurse Midwife

## 2018-10-08 ENCOUNTER — Telehealth: Payer: Self-pay | Admitting: Obstetrics & Gynecology

## 2018-10-08 NOTE — Telephone Encounter (Signed)
Called in to schedule a new ob appointment. Stated she would like to hold off on scheduling because she would like to be seen before the last week of May. No changes made.

## 2018-10-27 ENCOUNTER — Encounter: Payer: Self-pay | Admitting: Obstetrics and Gynecology

## 2018-10-27 DIAGNOSIS — Z3480 Encounter for supervision of other normal pregnancy, unspecified trimester: Secondary | ICD-10-CM | POA: Insufficient documentation

## 2018-10-28 ENCOUNTER — Other Ambulatory Visit: Payer: Self-pay

## 2018-10-28 ENCOUNTER — Encounter: Payer: Self-pay | Admitting: Obstetrics and Gynecology

## 2018-10-28 ENCOUNTER — Other Ambulatory Visit (HOSPITAL_COMMUNITY)
Admission: RE | Admit: 2018-10-28 | Discharge: 2018-10-28 | Disposition: A | Discharge status: Home / Self Care | Payer: Medicaid Other | Source: Ambulatory Visit | Attending: Obstetrics and Gynecology | Admitting: Obstetrics and Gynecology

## 2018-10-28 ENCOUNTER — Ambulatory Visit (INDEPENDENT_AMBULATORY_CARE_PROVIDER_SITE_OTHER): Payer: Medicaid Other | Admitting: Obstetrics and Gynecology

## 2018-10-28 ENCOUNTER — Telehealth: Payer: Self-pay | Admitting: *Deleted

## 2018-10-28 VITALS — BP 117/69 | HR 80 | Wt 211.4 lb

## 2018-10-28 DIAGNOSIS — Z98891 History of uterine scar from previous surgery: Secondary | ICD-10-CM | POA: Diagnosis not present

## 2018-10-28 DIAGNOSIS — Z3482 Encounter for supervision of other normal pregnancy, second trimester: Secondary | ICD-10-CM

## 2018-10-28 DIAGNOSIS — Z8619 Personal history of other infectious and parasitic diseases: Secondary | ICD-10-CM | POA: Diagnosis not present

## 2018-10-28 DIAGNOSIS — Z3A16 16 weeks gestation of pregnancy: Secondary | ICD-10-CM | POA: Diagnosis not present

## 2018-10-28 DIAGNOSIS — Z1331 Encounter for screening for depression: Secondary | ICD-10-CM

## 2018-10-28 DIAGNOSIS — Z3480 Encounter for supervision of other normal pregnancy, unspecified trimester: Secondary | ICD-10-CM | POA: Diagnosis not present

## 2018-10-28 NOTE — Progress Notes (Signed)
Behavioral health referral to [facility].

## 2018-10-28 NOTE — Progress Notes (Addendum)
INITIAL PRENATAL VISIT NOTE  Subjective:  Leslie Barrett is a 23 y.o. [id number] at 3250w5d by LMP being seen today for her initial prenatal visit. This is a planned pregnancy. She and partner are happy with the pregnancy. She was using pills for birth control but missed a few. She has an obstetric history significant for 1LTCS for arrest of dilation. She has a medical history significant for n/a.  Patient reports round ligament pain.  Contractions: Not present. Vag. Bleeding: None.   . Denies leaking of fluid.    Past Medical History:  Diagnosis Date  . H/O chlamydia infection   . Medical history non-contributory     Past Surgical History:  Procedure Laterality Date  . CESAREAN SECTION      OB History  Gravida Para Term Preterm AB Living  4 1 1   2 1   SAB TAB Ectopic Multiple Live Births  1   1   1     # Outcome Date GA Lbr Len/2nd Weight Sex Delivery Anes PTL Lv  4 Current           3 Term 03/10/17    F CS-LTranv Spinal  LIV  2 Ectopic           1 SAB             Social History   Socioeconomic History  . Marital status: Single    Spouse name: Not on file  . Number of children: Not on file  . Years of education: Not on file  . Highest education level: Not on file  Occupational History  . Not on file  Social Needs  . Financial resource strain: Not on file  . Food insecurity:    Worry: Not on file    Inability: Not on file  . Transportation needs:    Medical: Not on file    Non-medical: Not on file  Tobacco Use  . Smoking status: Never Smoker  . Smokeless tobacco: Never Used  Substance and Sexual Activity  . Alcohol use: Not Currently    Frequency: Never    Comment: 1-2 times per week - wine  . Drug use: No  . Sexual activity: Yes    Birth control/protection: None  Lifestyle  . Physical activity:    Days per week: Not on file    Minutes per session: Not on file  . Stress: Not on file  Relationships  . Social connections:    Talks on phone: Not on file     Gets together: Not on file    Attends religious service: Not on file    Active member of club or organization: Not on file    Attends meetings of clubs or organizations: Not on file    Relationship status: Not on file  Other Topics Concern  . Not on file  Social History Narrative  . Not on file    Family History  Problem Relation Age of Onset  . Diabetes Maternal Grandmother   . Diabetes Paternal Grandmother      Current Outpatient Medications:  .  Prenatal Vit-Fe Fumarate-FA (MULTIVITAMIN-PRENATAL) 27-0.8 MG TABS tablet, Take 1 tablet by mouth daily at 12 noon., Disp: , Rfl:   No Known Allergies  Review of Systems: Negative except for what is mentioned in HPI.  Objective:   Vitals:   10/28/18 0824  BP: 117/69  Pulse: 80  Weight: 211 lb 6.4 oz (95.9 kg)    Fetal Status: Fetal Heart Rate (bpm): 145  Physical Exam: BP 117/69   Pulse 80   Wt 211 lb 6.4 oz (95.9 kg)   LMP 07/03/2018 (Approximate)   BMI 36.29 kg/m  CONSTITUTIONAL: Well-developed, well-nourished female in no acute distress.  NEUROLOGIC: Alert and oriented to person, place, and time. Normal reflexes, muscle tone coordination. No cranial nerve deficit noted. PSYCHIATRIC: Normal mood and affect. Normal behavior. Normal judgment and thought content. SKIN: Skin is warm and dry. No rash noted. Not diaphoretic. No erythema. No pallor. HENT:  Normocephalic, atraumatic, External right and left ear normal. Oropharynx is clear and moist EYES: Conjunctivae and EOM are normal. Pupils are equal, round, and reactive to light. No scleral icterus.  NECK: Normal range of motion, supple, no masses CARDIOVASCULAR: Normal heart rate noted, regular rhythm RESPIRATORY: Effort and breath sounds normal, no problems with respiration noted BREASTS: symmetric, non-tender, no masses palpable, left nipple pierced ABDOMEN: Soft, nontender, nondistended, gravid, well healed pfannenstiel GU: normal appearing external female  genitalia, multiparous normal appearing cervix, scant white discharge in vagina, no lesions noted Bimanual: 15 weeks sized uterus, no adnexal tenderness or palpable lesions noted MUSCULOSKELETAL: Normal range of motion. EXT:  No edema and no tenderness. 2+ distal pulses.  Pt informed that the ultrasound is considered a limited OB ultrasound and is not intended to be a complete ultrasound exam.  Patient also informed that the ultrasound is not being completed with the intent of assessing for fetal or placental anomalies or any pelvic abnormalities.  Explained that the purpose of today's ultrasound is to assess for  viability.  Patient acknowledges the purpose of the exam and the limitations of the study.    Bedside: active fetal movement with cardiac activity, singleton IUP  Assessment and Plan:  Pregnancy: G4P1021 at [redacted]w[redacted]d by LMP  1. Supervision of other normal pregnancy, antepartum Reviewed office, multiple providers, virtual visits. - AFP, Serum, Open Spina Bifida - Genetic Screening - Enroll Patient in Babyscripts - Babyscripts Schedule Optimization - Cytology - PAP( [place]) - Culture, OB Urine - Obstetric Panel, Including HIV - Cervicovaginal ancillary only( [place]) -MFM [place] ordered today  2. H/O: C-section Dilated to 5 cm Need op report   Preterm labor symptoms and general obstetric precautions including but not limited to vaginal bleeding, contractions, leaking of fluid and fetal movement were reviewed in detail with the patient. waw  Please refer to After Visit Summary for other counseling recommendations.   Return in about 4 weeks (around 11/25/2018) for OB visit, virtual.  Conan Bowens 10/28/2018 9:12 AM

## 2018-10-28 NOTE — Telephone Encounter (Signed)
Pt called to office after appt today to request Zoloft Rx that she has been on before. Pt forgot to discuss with provider at visit today.  Reviewed with Dr Earlene Plater,  She recommends pt she Leslie Barrett at Clinic before medications are prescribed.  Pt made aware of plan and agrees.

## 2018-10-28 NOTE — Progress Notes (Signed)
Pt is here for initial OB visit. LMP 07/03/2018. EDD 04/09/2019. 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 Pt reports she tried to cope with marijuana. EPDS: 22.

## 2018-10-29 LAB — CERVICOVAGINAL ANCILLARY ONLY
Bacterial vaginitis: POSITIVE — AB
Candida vaginitis: POSITIVE — AB
Chlamydia: NEGATIVE
Neisseria Gonorrhea: NEGATIVE
Trichomonas: NEGATIVE

## 2018-10-30 LAB — URINE CULTURE, OB REFLEX

## 2018-10-30 LAB — CULTURE, OB URINE

## 2018-11-01 ENCOUNTER — Encounter: Payer: Self-pay | Admitting: Obstetrics and Gynecology

## 2018-11-01 DIAGNOSIS — R87610 Atypical squamous cells of undetermined significance on cytologic smear of cervix (ASC-US): Secondary | ICD-10-CM | POA: Insufficient documentation

## 2018-11-01 DIAGNOSIS — R87810 Cervical high risk human papillomavirus (HPV) DNA test positive: Secondary | ICD-10-CM

## 2018-11-01 LAB — CYTOLOGY - PAP
Diagnosis: UNDETERMINED — AB
HPV: DETECTED — AB

## 2018-11-01 MED ORDER — TERCONAZOLE 0.8 % VA CREA: 1.0000 | TOPICAL_CREAM | Freq: Every day | VAGINAL | 0 refills | Status: DC

## 2018-11-01 MED ORDER — METRONIDAZOLE 500 MG PO TABS: 500.0000 mg | ORAL_TABLET | Freq: Two times a day (BID) | ORAL | 0 refills | Status: DC

## 2018-11-01 NOTE — Addendum Note (Signed)
Addended by: Leroy Libman on: 11/01/2018 02:42 PM   Modules accepted: Orders

## 2018-11-05 ENCOUNTER — Encounter: Payer: Self-pay | Admitting: Obstetrics and Gynecology

## 2018-11-08 ENCOUNTER — Encounter: Payer: Self-pay | Admitting: Obstetrics and Gynecology

## 2018-11-09 ENCOUNTER — Telehealth: Payer: Self-pay

## 2018-11-09 ENCOUNTER — Encounter: Payer: Self-pay | Admitting: Obstetrics and Gynecology

## 2018-11-09 DIAGNOSIS — D582 Other hemoglobinopathies: Secondary | ICD-10-CM | POA: Insufficient documentation

## 2018-11-09 NOTE — Telephone Encounter (Signed)
Returned call and advised of results 

## 2018-11-11 ENCOUNTER — Other Ambulatory Visit: Payer: Self-pay

## 2018-11-11 ENCOUNTER — Ambulatory Visit (INDEPENDENT_AMBULATORY_CARE_PROVIDER_SITE_OTHER): Payer: Medicaid Other | Admitting: Clinical

## 2018-11-11 DIAGNOSIS — F4323 Adjustment disorder with mixed anxiety and depressed mood: Secondary | ICD-10-CM | POA: Diagnosis not present

## 2018-11-11 DIAGNOSIS — Z658 Other specified problems related to psychosocial circumstances: Secondary | ICD-10-CM | POA: Diagnosis not present

## 2018-11-11 MED ORDER — SERTRALINE HCL 50 MG PO TABS: 50.0000 mg | ORAL_TABLET | Freq: Every day | ORAL | 5 refills | Status: DC

## 2018-11-11 NOTE — BH Specialist Note (Signed)
Integrated Behavioral Health Initial Visit  MRN: [id number] Name: Kindra Wolske  Number of Integrated Behavioral Health Clinician visits:: 1/6 Session Start time: 1:04  Session End time: 1:47 Total time: 40 minutes  Type of Service: Integrated Behavioral Health- Individual/Family Interpretor:No. Interpretor Name and Language: n/a   Warm Hand Off Completed.       Type of Visit: Telephonic Patient location: [facility] Provider location: [facility] All persons participating in visit: Patient Heathr Schwerin and [facility] Jamie McMannes  Confirmed patient's address: Yes  Confirmed patient's phone number: Yes  Any changes to demographics: No   Confirmed patient's insurance: Yes  Any changes to patient's insurance: No   Discussed confidentiality: Yes    The following statements were read to the patient and/or legal guardian that are established with the [facility] Provider.  "The purpose of this phone visit is to provide behavioral health care while limiting exposure to the coronavirus (COVID19).  There is a possibility of technology failure and discussed alternative modes of communication if that failure occurs."  "By engaging in this telephone visit, you consent to the provision of healthcare.  Additionally, you authorize for your insurance to be billed for the services provided during this telephone visit."   Patient and/or legal guardian consented to telephone visit: Yes   STRENGTHS (Protective Factors/Coping Skills): Resiliency; open to change  SUBJECTIVE: Elizebath Kalas is a 23 y.o. female accompanied by n/a Patient was referred by Jaynie Collins, MD for positive depression screen. Patient reports the following symptoms/concerns: Pt states her goals this pregnancy are to stay healthy and find her own housing. Pt's primary symptoms are lack of interest, lack of sleep, overeating, irritability and excessive worry. Pt self-reports low dosage of Zoloft successfully to treat  depression and anxiety after her daughter was born almost 2 years ago in [place], but has been unmedicated for a year due to lack of insurance.  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 Reduce symptoms of: anxiety, depression, insomnia and stress 2. Increase knowledge and/or ability of: healthy habits and stress reduction  3. Demonstrate ability to: Increase healthy adjustment to current life circumstances and Improve medication compliance  INTERVENTIONS: Interventions utilized: Solution-Focused Strategies and Link to [employer]  Standardized Assessments completed: GAD-7 and PHQ 9  ASSESSMENT: Patient currently experiencing Adjustment disorder with mixed anxious and depressed mood and Psychosocial stress.   Patient may benefit from psychoeducation and brief therapeutic interventions regarding coping with symptoms of anxiety and depression .  PLAN: 1. Follow up with behavioral health clinician on : One week 2. Behavioral recommendations:  -Sign online consent today for referral to [employer]; this agency will make contact -Begin taking BH medication, as prescribed by medical provider -Download [employer] Webex for upcoming appointment -Continue to keep a positive outlook; continue to limit social media usage for as long as remains helpful to cope 3. Referral(s): Integrated Art gallery manager (In Clinic) and [employer]  Resources:  Housing 4. "From scale of 1-10, how likely are you to follow plan?":  10  Rae LipsJamie C McMannes, LCSW   [facility] 11/11/2018  Decreased Interest 3  Down, Depressed, Hopeless 1  PHQ - 2 Score 4  Altered sleeping 3  Tired, decreased energy 1  Change in appetite 3  Feeling bad or failure about yourself  1  Trouble concentrating 1  Moving slowly or fidgety/restless 0  Suicidal thoughts 0  PHQ-9 Score 13   GAD 7 : Generalized Anxiety Score 11/11/2018  Nervous, Anxious, on Edge 1  Control/stop worrying 2  Worry too much - different things 1  Trouble relaxing 1  Restless 1  Easily annoyed or irritable 3  Afraid - awful might happen 1  Total GAD 7 Score 10

## 2018-11-11 NOTE — Addendum Note (Signed)
Addended by: Jaynie Collins A on: 11/11/2018 04:00 PM   Modules accepted: Orders

## 2018-11-11 NOTE — Progress Notes (Signed)
On chart review, patient had been on Zoloft 50 mg daily. Will prescribe this for now. She will follow up with [facility] Counselor as scheduled. Patient was called and told to pick up prescription, and take as directed.  Jaynie Collins, MD, FACOG Obstetrician & Gynecologist, [facility] for [employer], [facility]

## 2018-11-12 LAB — AFP, SERUM, OPEN SPINA BIFIDA
AFP MoM: 0.97
AFP Value: 30.1 ng/mL
Gest. Age on Collection Date: 16.5 weeks
Maternal Age At EDD: 25.3 yr
OSBR Risk 1 IN: 10000
Test Results:: NEGATIVE
Weight: 211 [lb_av]

## 2018-11-12 LAB — OBSTETRIC PANEL, INCLUDING HIV
Antibody Screen: NEGATIVE
Basophils Absolute: 0.1 10*3/uL (ref 0.0–0.2)
Basos: 1 %
EOS (ABSOLUTE): 0.2 10*3/uL (ref 0.0–0.4)
Eos: 3 %
HIV Screen 4th Generation wRfx: NONREACTIVE
Hematocrit: 38.8 % (ref 34.0–46.6)
Hemoglobin: 13.8 g/dL (ref 11.1–15.9)
Hepatitis B Surface Ag: NEGATIVE
Immature Grans (Abs): 0 10*3/uL (ref 0.0–0.1)
Immature Granulocytes: 1 %
Lymphocytes Absolute: 2.6 10*3/uL (ref 0.7–3.1)
Lymphs: 29 %
MCH: 31.2 pg (ref 26.6–33.0)
MCHC: 35.6 g/dL (ref 31.5–35.7)
MCV: 88 fL (ref 79–97)
Monocytes Absolute: 0.5 10*3/uL (ref 0.1–0.9)
Monocytes: 6 %
Neutrophils Absolute: 5.3 10*3/uL (ref 1.4–7.0)
Neutrophils: 60 %
Platelets: 398 10*3/uL (ref 150–450)
RBC: 4.42 x10E6/uL (ref 3.77–5.28)
RDW: 12.3 % (ref 11.7–15.4)
RPR Ser Ql: NONREACTIVE
Rh Factor: POSITIVE
Rubella Antibodies, IGG: 1 index (ref >0.99)
WBC: 8.7 10*3/uL (ref 3.4–10.8)

## 2018-11-18 ENCOUNTER — Encounter: Payer: Self-pay | Admitting: *Deleted

## 2018-11-18 ENCOUNTER — Telehealth: Payer: Self-pay | Admitting: Clinical

## 2018-11-18 ENCOUNTER — Ambulatory Visit (HOSPITAL_COMMUNITY)
Admission: RE | Admit: 2018-11-18 | Discharge: 2018-11-18 | Disposition: A | Discharge status: Home / Self Care | Payer: Medicaid Other | Source: Ambulatory Visit | Attending: Obstetrics and Gynecology | Admitting: Obstetrics and Gynecology

## 2018-11-18 ENCOUNTER — Other Ambulatory Visit: Payer: Self-pay

## 2018-11-18 DIAGNOSIS — Z3480 Encounter for supervision of other normal pregnancy, unspecified trimester: Secondary | ICD-10-CM | POA: Diagnosis not present

## 2018-11-18 DIAGNOSIS — Z3A19 19 weeks gestation of pregnancy: Secondary | ICD-10-CM

## 2018-11-18 DIAGNOSIS — O34219 Maternal care for unspecified type scar from previous cesarean delivery: Secondary | ICD-10-CM | POA: Diagnosis not present

## 2018-11-18 DIAGNOSIS — Z363 Encounter for antenatal screening for malformations: Secondary | ICD-10-CM | POA: Diagnosis not present

## 2018-11-18 NOTE — Telephone Encounter (Signed)
Agreed-upon call to patient; pt is having difficulty sleeping at night, and feeling very tired during the daytime after taking Zoloft, and has not figured out how to get onto [employer]. No other concerns at this time.  (Less than 5 minute visit).    Pt agrees to:  1.  Start taking Zoloft at bedtime instead of in the morning, nightly for one week  2. Consider going outside every morning for 20 minutes, even on cloudy days.  3. Access MyChart. Contact MyChart Help Desk  If any problems accessing MyChart, at [phone].  4. We will discuss any symptom changes in one week, Thursday, June 2nd, at 10:00am at a [employer] visit.

## 2018-11-19 ENCOUNTER — Inpatient Hospital Stay (HOSPITAL_COMMUNITY)
Admission: AD | Admit: 2018-11-19 | Discharge: 2018-11-19 | Disposition: A | Discharge status: Home / Self Care | Payer: Medicaid Other | Attending: Obstetrics and Gynecology | Admitting: Obstetrics and Gynecology

## 2018-11-19 ENCOUNTER — Other Ambulatory Visit: Payer: Self-pay | Admitting: Student

## 2018-11-19 ENCOUNTER — Other Ambulatory Visit: Payer: Self-pay

## 2018-11-19 DIAGNOSIS — B379 Candidiasis, unspecified: Secondary | ICD-10-CM | POA: Insufficient documentation

## 2018-11-19 DIAGNOSIS — Z3480 Encounter for supervision of other normal pregnancy, unspecified trimester: Secondary | ICD-10-CM

## 2018-11-19 DIAGNOSIS — Z98891 History of uterine scar from previous surgery: Secondary | ICD-10-CM

## 2018-11-19 MED ORDER — TERCONAZOLE 0.4 % VA CREA: 1.0000 | TOPICAL_CREAM | Freq: Every day | VAGINAL | 0 refills | Status: DC

## 2018-11-19 NOTE — MAU Note (Signed)
Pt reports she was put on metronidazole for BV and cream for a yeast infection. Finished yeast infection meds last weekend. Pt noticed  a white and green vaginal discharge on wed. Had some spotting today. Thought it may be a reaction to the medications she was taking. Still feels vaginal itching but no abd pain

## 2018-11-19 NOTE — Progress Notes (Signed)
Patient arrived at MAU with her toddler; was informed that she cannot be seen until her toddler has someone to watch her. Patient says that she does not have anyone, but says she is concerned about her discharge. I explained that her symptoms sound like yeast, and that it does not appear that she is having an OB emergency. If she would like a further work up, she can call her doctor's office or come back without her child.  I am willing to refill her Terazol but I cannot do any more exam or tests under the no visitor policy.  Patient understands and will send RX for Terazole 7 day course.   Luna Kitchens

## 2018-11-19 NOTE — MAU Note (Signed)
New Rx for yeast medication sent by provider

## 2018-11-22 ENCOUNTER — Telehealth: Payer: Self-pay

## 2018-11-22 NOTE — Telephone Encounter (Signed)
20 wks ROB Pt recently seen at MAU  for vaginal irritation Was Rx yeast cream  Pt states no relief pt states she was sent a 3 day Rx for the yeast . Please advise on medication management.

## 2018-11-24 NOTE — Telephone Encounter (Signed)
Patient made aware to give medication time to work  And discuss at upcoming appt.  Pt agreeable and voiced understanding.

## 2018-11-24 NOTE — Telephone Encounter (Signed)
Assessment:  She has yeast and BV on her wet prep.                           She was prescribed Flagyl and Terazol appropriately.  Did she take both? Plan:                She needs to give the medication time to work.

## 2018-11-25 ENCOUNTER — Other Ambulatory Visit: Payer: Self-pay

## 2018-11-25 ENCOUNTER — Ambulatory Visit (INDEPENDENT_AMBULATORY_CARE_PROVIDER_SITE_OTHER): Payer: Medicaid Other | Admitting: Obstetrics

## 2018-11-25 ENCOUNTER — Ambulatory Visit (INDEPENDENT_AMBULATORY_CARE_PROVIDER_SITE_OTHER): Payer: Medicaid Other | Admitting: Clinical

## 2018-11-25 ENCOUNTER — Encounter: Payer: Self-pay | Admitting: Obstetrics

## 2018-11-25 DIAGNOSIS — Z3480 Encounter for supervision of other normal pregnancy, unspecified trimester: Secondary | ICD-10-CM

## 2018-11-25 DIAGNOSIS — F4321 Adjustment disorder with depressed mood: Secondary | ICD-10-CM

## 2018-11-25 DIAGNOSIS — Z658 Other specified problems related to psychosocial circumstances: Secondary | ICD-10-CM | POA: Diagnosis not present

## 2018-11-25 DIAGNOSIS — Z3A20 20 weeks gestation of pregnancy: Secondary | ICD-10-CM

## 2018-11-25 DIAGNOSIS — Z98891 History of uterine scar from previous surgery: Secondary | ICD-10-CM

## 2018-11-25 DIAGNOSIS — Z3482 Encounter for supervision of other normal pregnancy, second trimester: Secondary | ICD-10-CM

## 2018-11-25 MED ORDER — BLOOD PRESSURE CUFF MISC: 1.0000 | Freq: Once | 0 refills | Status: AC

## 2018-11-25 NOTE — Progress Notes (Signed)
Pt is on the phone preparing for virtual visit with provider. [redacted]w[redacted]d.

## 2018-11-25 NOTE — BH Specialist Note (Signed)
Integrated Behavior: Arrival Time:: 10:15am  End time: 10:35am Total time: 20 minutes  Type of Service: Integrated Behavioral Health- Individual/Family Interpretor:No. Interpretor Name and Language: n/a   Warm Hand Off Completed.       SUBJECTIVE: Leslie Barrett is a 23 y.o. female accompanied by 1yo daughter Patient was referred by Jaynie Collins, MD for positive depression screen at previous visit Patient reports the following symptoms/concerns: Pt states she is taking her prenatal vitamins, and Zoloft, at night, and is working with [employer] to find housing. Pt's primary concern today is finances during covid/reduced work hours, and is grieving the loss of her great uncle, due to [place].  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 Maintain reduction of  symptoms of: anxiety, depression, insomnia and stress 2. Demonstrate ability to: Increase healthy adjustment to current life circumstances and Begin healthy grieving over loss  INTERVENTIONS: Interventions utilized: Supportive Counseling and Medication Monitoring  Standardized Assessments completed: GAD-7 and PHQ 9  ASSESSMENT: Patient currently experiencing Grief and Psychosocial stress.    Patient may benefit from continued brief therapeutic intervention regarding maintaining reduction of symptoms of depression and anxiety in the midst of current grief and life  stress.  PLAN: 1. Follow up with behavioral health clinician on : As needed, if symptoms increase or at pt request 2. Behavioral recommendations:  -Drive safely to see family today for great uncle's service -Continue taking Zoloft as prescribed and prenatal vitamin daily -Continue working with [employer] to find permanent housing  -Continue keeping a positive outlook and limiting social media during time of grieving 3. Referral(s): Integrated [employer] (In Clinic) 4. "From scale of 1-10, how likely are you to follow plan?": 10  Rae Lips, LCSW  Depression screen [facility] 2/9 11/25/2018 11/11/2018  Decreased Interest 0 3  Down, Depressed, Hopeless 0 1  PHQ - 2 Score 0 4  Altered sleeping 3 3  Tired, decreased energy 0 1  Change in appetite 0 3  Feeling bad or failure about yourself  0 1  Trouble concentrating 0 1  Moving slowly or fidgety/restless 0 0  Suicidal thoughts 0 0  PHQ-9 Score 3 13   GAD 7 : Generalized Anxiety Score 11/25/2018 11/11/2018  Nervous, Anxious, on Edge 0 1  Control/stop worrying 0 2  Worry too much - different things 0 1  Trouble relaxing 0 1  Restless 0 1  Easily annoyed or irritable 0 3  Afraid - awful might happen 0 1  Total GAD 7 Score 0 10

## 2018-11-25 NOTE — Progress Notes (Signed)
TELEHEALTH OBSTETRICS PRENATAL VIRTUAL VIDEO VISIT ENCOUNTER NOTE  [facility]   I connected with Leslie CondonAlexis Goffredo on 11/25/18 at  8:30 AM [facility]Ex  OB MyChart Video Encounter at home and verified that I am speaking with the correct person using two identifiers.   I discussed the limitations, risks, security and privacy concerns of performing an evaluation and management service by telephone and the availability of in person appointments. I also discussed with the patient that there may be a patient responsible charge related to this service. The patient expressed understanding and agreed to proceed. Subjective:  Leslie Barrett is a 23 y.o. [phone] at 5752w5d being seen today for ongoing prenatal care.  She is currently monitored for the following issues for this low-risk pregnancy and has Supervision of other normal pregnancy, antepartum; H/O: C-section; H/O chlamydia infection; ASCUS with positive high risk HPV cervical; and Hemoglobin C trait (HCC) on their problem list.  Patient reports no complaints.  Contractions: Not present. Vag. Bleeding: None.  Movement: Present. Denies any leaking of fluid.   The following portions of the patient's history were reviewed and updated as appropriate: allergies, current medications, past family history, past medical history, past social history, past surgical history and problem list.   Objective:  There were no vitals filed for this visit.  Fetal Status:     Movement: Present     General:  Alert, oriented and cooperative. Patient is in no acute distress.  Respiratory: Normal respiratory effort, no problems with respiration noted  Mental Status: Normal mood and affect. Normal behavior. Normal judgment and thought content.  Rest of physical exam deferred due to type of encounter  Imaging: [place] Mfm Ob Detail +14 Wk  Result Date: 11/18/2018  ----------------------------------------------------------------------  OBSTETRICS REPORT                       (Signed Final 11/18/2018 09:15 am) ---------------------------------------------------------------------- Patient Info  ID #:       [id number]                          D.O.B.:  07-28-1995 (22 yrs)  Name:       Leslie Barrett                  Visit Date: 11/18/2018 08:11 am ---------------------------------------------------------------------- Performed By  Performed By:     Rennie PlowmanErica Lyskawa          [address]     [address]                                                             [address]  Attending:        Noralee Spaceavi Shankar MD        [facility]  Referred By:      Conan Bowens                    MD ---------------------------------------------------------------------- Orders   #  Description                          Code         Ordered By   1  [place] MFM OB DETAIL +14 WK              76811.01     KELLY DAVIS  ----------------------------------------------------------------------   #  Order #                    Accession #                 Episode #   1  [id number]                  [id number]                  [id number]  ---------------------------------------------------------------------- Indications   [redacted] weeks gestation of pregnancy                Z3A.19   Previous cesarean delivery, antepartum         O34.219   Encounter for antenatal screening for          Z36.3   malformations (low risk NIPT, female)   History of sickle cell trait                   Z86.2  ---------------------------------------------------------------------- Vital Signs  Weight (lb): 170                               Height:        5'4"  BMI:         29.18 ---------------------------------------------------------------------- Fetal Evaluation  Num Of Fetuses:         1  Fetal Heart Rate(bpm):  155  Cardiac Activity:        Observed  Presentation:           Cephalic  Placenta:               Posterior  P. Cord Insertion:      Visualized, central  Amniotic Fluid  AFI FV:      Within normal limits                              Largest Pocket(cm)                              4.61 ---------------------------------------------------------------------- Biometry  BPD:      43.4  mm     G. Age:  19w 1d         26  %    CI:        72.67   %    70 - 86  FL/HC:      19.9   %    16.8 - 19.8  HC:      161.9  mm     G. Age:  19w 0d         14  %    HC/AC:      1.18        1.09 - 1.39  AC:      136.8  mm     G. Age:  19w 1d         26  %    FL/BPD:     74.2   %  FL:       32.2  mm     G. Age:  20w 0d         54  %    FL/AC:      23.5   %    20 - 24  HUM:      29.6  mm     G. Age:  19w 5d         52  %  CER:      20.2  mm     G. Age:  19w 1d         40  %  NFT:       2.7  mm  LV:        5.3  mm  CM:        4.7  mm  Est. FW:     295  gm    0 lb 10 oz      43  % ---------------------------------------------------------------------- OB History  Gravidity:    4         Term:   1        Prem:   0        SAB:   1  TOP:          0       Ectopic:  1        Living: 1 ---------------------------------------------------------------------- Gestational Age  LMP:           19w 5d        Date:  07/03/18                 EDD:   04/09/19  U/S Today:     19w 2d                                        EDD:   04/12/19  Best:          19w 5d     Det. By:  LMP  (07/03/18)          EDD:   04/09/19 ---------------------------------------------------------------------- Anatomy  Cranium:               Appears normal         LVOT:                   Appears normal  Cavum:                 Appears normal         Aortic Arch:            Appears normal  Ventricles:            Appears normal  Ductal Arch:            Appears normal  Choroid Plexus:        Appears normal         Diaphragm:              Appears normal   Cerebellum:            Appears normal         Stomach:                Appears normal, left                                                                        sided  Posterior Fossa:       Appears normal         Abdomen:                Appears normal  Nuchal Fold:           Appears normal         Abdominal Wall:         Appears nml (cord                                                                        insert, abd wall)  Face:                  Appears normal         Cord Vessels:           Appears normal (3                         (orbits and profile)                           vessel cord)  Lips:                  Appears normal         Kidneys:                Appear normal  Palate:                Appears normal         Bladder:                Appears normal  Thoracic:              Appears normal         Spine:                  Appears normal  Heart:                 Appears normal         Upper Extremities:      Appears normal                         (  4CH, axis, and                         situs)  RVOT:                  Appears normal         Lower Extremities:      Appears normal  Other:  Normal genaitalia. Female gender. Nasal bone visualized. Heels and          5th digit visualized. ---------------------------------------------------------------------- Cervix Uterus Adnexa  Cervix  Length:           4.24  cm.  Normal appearance by transabdominal scan.  Left Ovary  Within normal limits.  Right Ovary  Within normal limits.  Cul De Sac  No free fluid seen.  Adnexa  No abnormality visualized. ---------------------------------------------------------------------- Impression  We performed fetal anatomy scan. No makers of  aneuploidies or fetal structural defects are seen. Fetal  biometry is consistent with her previously-established dates.  Amniotic fluid is normal and good fetal activity is seen.  Placenta is posterior and there is no evidence of previa or  accreta.  On cell-free fetal DNA screening, the risks  of fetal  aneuploidies are not increased. MSAFP screening showed  low risk for open-neural tube defects. ---------------------------------------------------------------------- Recommendations  Follow-up as clinically indicated. ----------------------------------------------------------------------                  Noralee Space, MD Electronically Signed Final Report   11/18/2018 09:15 am ----------------------------------------------------------------------   Assessment and Plan:  Pregnancy: [id number] at [redacted]w[redacted]d 1. Supervision of other normal pregnancy, antepartum Rx: - Blood Pressure Monitoring (BLOOD PRESSURE CUFF) MISC; 1 Device by Does not apply route once for 1 dose.  Dispense: 1 each; Refill: 0  2. H/O: C-section   Preterm labor symptoms and general obstetric precautions including but not limited to vaginal bleeding, contractions, leaking of fluid and fetal movement were reviewed in detail with the patient. I discussed the assessment and treatment plan with the patient. The patient was provided an opportunity to ask questions and all were answered. The patient agreed with the plan and demonstrated an understanding of the instructions. The patient was advised to call back or seek an in-person office evaluation/go to MAU at [facility] for any urgent or concerning symptoms. Please refer to After Visit Summary for other counseling recommendations.   I provided 10 minutes of face-to-face time during this encounter.  Return in about 4 weeks (around 12/23/2018) for [facility].  Future Appointments  Date Time Provider Department Center  11/25/2018 10:00 AM [facility] [place] [facility]    Coral Ceo, MD Center for [facility], [facility] 11-25-2018

## 2018-11-26 NOTE — Telephone Encounter (Signed)
Should give more time for medication to work.

## 2018-11-29 ENCOUNTER — Other Ambulatory Visit: Payer: Self-pay

## 2018-12-23 ENCOUNTER — Encounter: Payer: Self-pay | Admitting: Obstetrics and Gynecology

## 2018-12-23 ENCOUNTER — Ambulatory Visit (INDEPENDENT_AMBULATORY_CARE_PROVIDER_SITE_OTHER): Payer: Medicaid Other | Admitting: Obstetrics and Gynecology

## 2018-12-23 VITALS — BP 117/86 | HR 77

## 2018-12-23 DIAGNOSIS — R87610 Atypical squamous cells of undetermined significance on cytologic smear of cervix (ASC-US): Secondary | ICD-10-CM

## 2018-12-23 DIAGNOSIS — Z98891 History of uterine scar from previous surgery: Secondary | ICD-10-CM

## 2018-12-23 DIAGNOSIS — Z3A24 24 weeks gestation of pregnancy: Secondary | ICD-10-CM

## 2018-12-23 DIAGNOSIS — R87810 Cervical high risk human papillomavirus (HPV) DNA test positive: Secondary | ICD-10-CM

## 2018-12-23 DIAGNOSIS — Z3480 Encounter for supervision of other normal pregnancy, unspecified trimester: Secondary | ICD-10-CM

## 2018-12-23 DIAGNOSIS — O26892 Other specified pregnancy related conditions, second trimester: Secondary | ICD-10-CM

## 2018-12-23 DIAGNOSIS — Z8619 Personal history of other infectious and parasitic diseases: Secondary | ICD-10-CM

## 2018-12-23 NOTE — Progress Notes (Signed)
Pt states she is doing well and has no concerns today. Pt has not yet opened BP cuff and set up.  Pt advised to try and set it up today and make [place] aware of BP reading.

## 2018-12-23 NOTE — Progress Notes (Signed)
   [place] VIRTUAL VIDEO VISIT ENCOUNTER NOTE  Provider location: Center for [employer] at [place]   I connected with [name] on 12/23/18 at  9:15 AM EDT by WebEx Video Encounter at home and verified that I am speaking with the correct person using two identifiers.   I discussed the limitations, risks, security and privacy concerns of performing an evaluation and management service by telephone and the availability of in person appointments. I also discussed with the patient that there may be a patient responsible charge related to this service. The patient expressed understanding and agreed to proceed. Subjective:  Leslie Barrett is a 23 y.o. [phone] at [redacted]w[redacted]d being seen today for ongoing prenatal care.  She is currently monitored for the following issues for this low-risk pregnancy and has Supervision of other normal pregnancy, antepartum; H/O: C-section; H/O chlamydia infection; ASCUS with positive high risk HPV cervical; and Hemoglobin C trait ([place]) on their problem list.  Patient reports no complaints.  Contractions: Not present. Vag. Bleeding: None.  Movement: Present. Denies any leaking of fluid.   The following portions of the patient's history were reviewed and updated as appropriate: allergies, current medications, past family history, past medical history, past social history, past surgical history and problem list.   Objective:   Vitals:   12/23/18 0906  BP: 117/86  Pulse: 77    Fetal Status:     Movement: Present     General:  Alert, oriented and cooperative. Patient is in no acute distress.  Respiratory: Normal respiratory effort, no problems with respiration noted  Mental Status: Normal mood and affect. Normal behavior. Normal judgment and thought content.  Rest of physical exam deferred due to type of encounter  Imaging: No results found.  Assessment and Plan:  Pregnancy: G4P1021 at [redacted]w[redacted]d  1. Supervision of other normal  pregnancy, antepartum  2. ASCUS with positive high risk HPV cervical Pap pp  3. H/O: C-section Requested records again from prior OB  4. H/O chlamydia infection Neg TOC  Preterm labor symptoms and general obstetric precautions including but not limited to vaginal bleeding, contractions, leaking of fluid and fetal movement were reviewed in detail with the patient. I discussed the assessment and treatment plan with the patient. The patient was provided an opportunity to ask questions and all were answered. The patient agreed with the plan and demonstrated an understanding of the instructions. The patient was advised to call back or seek an in-person office evaluation/go to MAU at [facility] for any urgent or concerning symptoms. Please refer to After Visit Summary for other counseling recommendations.   I provided 15 minutes of face-to-face time during this encounter.  Return in about 3 weeks (around 01/13/2019) for OB visit (MD), in person, 3rd trim labs, 2 hr GTT.  No future appointments.  Sloan Leiter, MD Center for [place], [place]

## 2019-01-13 ENCOUNTER — Ambulatory Visit (INDEPENDENT_AMBULATORY_CARE_PROVIDER_SITE_OTHER): Payer: Medicaid Other | Admitting: Certified Nurse Midwife

## 2019-01-13 ENCOUNTER — Other Ambulatory Visit: Payer: Self-pay

## 2019-01-13 ENCOUNTER — Encounter: Payer: Self-pay | Admitting: Certified Nurse Midwife

## 2019-01-13 ENCOUNTER — Other Ambulatory Visit: Payer: Medicaid Other

## 2019-01-13 VITALS — BP 138/64 | HR 85 | Temp 98.3°F | Wt 231.0 lb

## 2019-01-13 DIAGNOSIS — Z3A27 27 weeks gestation of pregnancy: Secondary | ICD-10-CM

## 2019-01-13 DIAGNOSIS — Z3480 Encounter for supervision of other normal pregnancy, unspecified trimester: Secondary | ICD-10-CM

## 2019-01-13 DIAGNOSIS — Z98891 History of uterine scar from previous surgery: Secondary | ICD-10-CM

## 2019-01-13 DIAGNOSIS — D582 Other hemoglobinopathies: Secondary | ICD-10-CM

## 2019-01-13 DIAGNOSIS — O34219 Maternal care for unspecified type scar from previous cesarean delivery: Secondary | ICD-10-CM

## 2019-01-13 NOTE — Patient Instructions (Addendum)
Preparing for Vaginal Birth After Cesarean Delivery Vaginal birth after cesarean delivery (VBAC) is giving birth vaginally after previously delivering a baby through a cesarean section (C-section). You and your health are provider will discuss your options and whether you may be a good candidate for VBAC. What are my options? After a cesarean delivery, your options for future deliveries may include:  Scheduled repeat cesarean delivery. This is done in a hospital with an operating room.  Trial of labor after cesarean (TOLAC). A successful TOLAC results in a vaginal delivery. If it is not successful, you will need to have a cesarean delivery. TOLAC should be attempted in facilities where an emergency cesarean delivery can be performed. It should not be done as a home birth. Talk with your health care provider about the risks and benefits of each option early in your pregnancy. The best option for you will depend on your preferences and your overall health as well as your baby's. What should I know about my past cesarean delivery? It is important to know what type of incision was made in your uterus in a past cesarean delivery. The type of incision can affect the success of your TOLAC. Types of incisions include:  Low transverse. This is a side-to-side cut low on your uterus. The scar on your skin looks like a horizontal line just above your pubic area. This type of cut is the most common and makes you a good candidate for TOLAC.  Low vertical. This is an up-and-down cut low on your uterus. The scar on your skin looks like a vertical line between your pubic area and belly button. This type of cut puts you at higher risk for problems during TOLAC.  High vertical or classical. This is an up-and-down cut high on your uterus. The scar on your skin looks like a vertical line that runs over the top of your belly button. This type of cut has the highest risk for problems and usually means that TOLAC is not an  option. When is VBAC not an option? As you progress through your pregnancy, circumstances may change and you may need to reconsider your options. Your situation may also change even as you begin TOLAC. Your health care provider may not want you to attempt a VBAC if you:  Need to have labor started (induced) because your cervix is not ready for labor.  Have never had a vaginal delivery.  Have had more than two cesarean deliveries.  Are overdue.  Are pregnant with a very large baby.  Have a condition that causes high blood pressure (preeclampsia). Questions to ask your health care provider  Am I a good candidate for TOLAC?  What are my chances of a successful vaginal delivery?  Is my preferred birth location equipped for a TOLAC?  What are my pain management options during a TOLAC? Where to find more information  American Congress of Obstetricians and Gynecologists: www.acog.org  American College of Nurse-Midwives: www.midwife.org Summary  Vaginal birth after cesarean delivery (VBAC) is giving birth vaginally after previously delivering a baby through a cesarean section (C-section).  VBAC may be a safe and appropriate option for you depending on your medical history and other risk factors. Talk with your health care provider about the options available to you, and the risks and benefits of each early in your pregnancy.  TOLAC should be attempted in facilities where emergency cesarean section procedures can be performed. This information is not intended to replace advice given to you by   your health care provider. Make sure you discuss any questions you have with your health care provider. Document Released: 02/25/2011 Document Revised: 10/05/2018 Document Reviewed: 09/18/2016 Elsevier Patient Education  2020 [employer]  Third Trimester of Pregnancy  The third trimester is from week 28 through week 40 (months 7 through 9). This trimester is when your unborn baby (fetus) is  growing very fast. At the end of the ninth month, the unborn baby is about 20 inches in length. It weighs about 6-10 pounds. Follow these instructions at home: Medicines  Take over-the-counter and prescription medicines only as told by your doctor. Some medicines are safe and some medicines are not safe during pregnancy.  Take a prenatal vitamin that contains at least 600 micrograms (mcg) of folic acid.  If you have trouble pooping (constipation), take medicine that will make your stool soft (stool softener) if your doctor approves. Eating and drinking   Eat regular, healthy meals.  Avoid raw meat and uncooked cheese.  If you get low calcium from the food you eat, talk to your doctor about taking a daily calcium supplement.  Eat four or five small meals rather than three large meals a day.  Avoid foods that are high in fat and sugars, such as fried and sweet foods.  To prevent constipation: ? Eat foods that are high in fiber, like fresh fruits and vegetables, whole grains, and beans. ? Drink enough fluids to keep your pee (urine) clear or pale yellow. Activity  Exercise only as told by your doctor. Stop exercising if you start to have cramps.  Avoid heavy lifting, wear low heels, and sit up straight.  Do not exercise if it is too hot, too humid, or if you are in a place of great height (high altitude).  You may continue to have sex unless your doctor tells you not to. Relieving pain and discomfort  Wear a good support bra if your breasts are tender.  Take frequent breaks and rest with your legs raised if you have leg cramps or low back pain.  Take warm water baths (sitz baths) to soothe pain or discomfort caused by hemorrhoids. Use hemorrhoid cream if your doctor approves.  If you develop puffy, bulging veins (varicose veins) in your legs: ? Wear support hose or compression stockings as told by your doctor. ? Raise (elevate) your feet for 15 minutes, 3-4 times a day. ?  Limit salt in your food. Safety  Wear your seat belt when driving.  Make a list of emergency phone numbers, including numbers for family, friends, the hospital, and police and fire departments. Preparing for your baby's arrival To prepare for the arrival of your baby:  Take prenatal classes.  Practice driving to the hospital.  Visit the hospital and tour the maternity area.  Talk to your work about taking leave once the baby comes.  Pack your hospital bag.  Prepare the baby's room.  Go to your doctor visits.  Buy a rear-facing car seat. Learn how to install it in your car. General instructions  Do not use hot tubs, steam rooms, or saunas.  Do not use any products that contain nicotine or tobacco, such as cigarettes and e-cigarettes. If you need help quitting, ask your doctor.  Do not drink alcohol.  Do not douche or use tampons or scented sanitary pads.  Do not cross your legs for long periods of time.  Do not travel for long distances unless you must. Only do so if your doctor says  it is okay.  Visit your dentist if you have not gone during your pregnancy. Use a soft toothbrush to brush your teeth. Be gentle when you floss.  Avoid cat litter boxes and soil used by cats. These carry germs that can cause birth defects in the baby and can cause a loss of your baby (miscarriage) or stillbirth.  Keep all your prenatal visits as told by your doctor. This is important. Contact a doctor if:  You are not sure if you are in labor or if your water has broken.  You are dizzy.  You have mild cramps or pressure in your lower belly.  You have a nagging pain in your belly area.  You continue to feel sick to your stomach, you throw up, or you have watery poop.  You have bad smelling fluid coming from your vagina.  You have pain when you pee. Get help right away if:  You have a fever.  You are leaking fluid from your vagina.  You are spotting or bleeding from your  vagina.  You have severe belly cramps or pain.  You lose or gain weight quickly.  You have trouble catching your breath and have chest pain.  You notice sudden or extreme puffiness (swelling) of your face, hands, ankles, feet, or legs.  You have not felt the baby move in over an hour.  You have severe headaches that do not go away with medicine.  You have trouble seeing.  You are leaking, or you are having a gush of fluid, from your vagina before you are 37 weeks.  You have regular belly spasms (contractions) before you are 37 weeks. Summary  The third trimester is from week 28 through week 40 (months 7 through 9). This time is when your unborn baby is growing very fast.  Follow your doctor's advice about medicine, food, and activity.  Get ready for the arrival of your baby by taking prenatal classes, getting all the baby items ready, preparing the baby's room, and visiting your doctor to be checked.  Get help right away if you are bleeding from your vagina, or you have chest pain and trouble catching your breath, or if you have not felt your baby move in over an hour. This information is not intended to replace advice given to you by your health care provider. Make sure you discuss any questions you have with your health care provider. Document Released: 09/03/2009 Document Revised: 09/30/2018 Document Reviewed: 07/15/2016 Elsevier Patient Education  2020 [employer].   Contraception Choices Contraception, also called birth control, means things to use or ways to try not to get pregnant. Hormonal birth control This kind of birth control uses hormones. Here are some types of hormonal birth control:  A tube that is put under skin of the arm (implant). The tube can stay in for as long as 3 years.  Shots to get every 3 months (injections).  Pills to take every day (birth control pills).  A patch to change 1 time each week for 3 weeks (birth control patch). After that, the  patch is taken off for 1 week.  A ring to put in the vagina. The ring is left in for 3 weeks. Then it is taken out of the vagina for 1 week. Then a new ring is put in.  Pills to take after unprotected sex (emergency birth control pills). IUD (intrauterine) birth control An IUD is a small, T-shaped piece of plastic. It is put inside the uterus.  There are two kinds:  Hormone IUD. This kind can stay in for 5 years.  Copper IUD. This kind can stay in for 10 years. Permanent birth control Here are some types of permanent birth control:  Surgery to block the fallopian tubes.  Having an insert put into each fallopian tube.  Surgery to tie off the tubes that carry sperm (vasectomy). Natural planning birth control Here are some types of natural planning birth control:  Not having sex on the days the woman could get pregnant.  Using a calendar: ? To keep track of the length of each period. ? To find out what days pregnancy can happen. ? To plan to not have sex on days when pregnancy can happen.  Watching for symptoms of ovulation and not having sex during ovulation. One way the woman can check for ovulation is to check her temperature.  Waiting to have sex until after ovulation. Summary  Contraception, also called birth control, means things to use or ways to try not to get pregnant.  Hormonal methods of birth control include implants, injections, pills, patches, vaginal rings, and emergency birth control pills.  There are two types of IUD (intrauterine device) birth control. An IUD can be put in a woman's uterus to prevent pregnancy for 5 years.  Permanent sterilization can be done through a procedure for males, females, or both.  Natural planning methods involve not having sex on the days when the woman could get pregnant. This information is not intended to replace advice given to you by your health care provider. Make sure you discuss any questions you have with your health care  provider. Document Released: 04/06/2009 Document Revised: 09/29/2018 Document Reviewed: 06/19/2016 Elsevier Patient Education  2020 [employer]

## 2019-01-13 NOTE — Progress Notes (Signed)
   PRENATAL VISIT NOTE  Subjective:  Leslie Barrett is a 23 y.o. [phone] at [redacted]w[redacted]d being seen today for ongoing prenatal care.  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  Patient reports no complaints.  Contractions: Irritability. Vag. Bleeding: None.  Movement: Present. Denies leaking of fluid.   The following portions of the patient's history were reviewed and updated as appropriate: allergies, current medications, past family history, past medical history, past social history, past surgical history and problem list.   Objective:   Vitals:   01/13/19 0930  BP: 138/64  Pulse: 85  Temp: 98.3 F (36.8 C)  Weight: 231 lb (104.8 kg)    Fetal Status: Fetal Heart Rate (bpm): 140 Fundal Height: 30 cm Movement: Present     General:  Alert, oriented and cooperative. Patient is in no acute distress.  Skin: Skin is warm and dry. No rash noted.   Cardiovascular: Normal heart rate noted  Respiratory: Normal respiratory effort, no problems with respiration noted  Abdomen: Soft, gravid, appropriate for gestational age.  Pain/Pressure: Absent     Pelvic: Cervical exam deferred        Extremities: Normal range of motion.  Edema: None  Mental Status: Normal mood and affect. Normal behavior. Normal judgment and thought content.   Assessment and Plan:  Pregnancy: G4P1021 at [redacted]w[redacted]d 1. Supervision of other normal pregnancy, antepartum - Patient doing well, no complaints - Routine prenatal care  - Anticipatory guidance on upcoming appointments  - Educated and discussed GTT being done today and what to expect if results are abnormal  - CBC - Glucose Tolerance, 2 Hours w/1 Hour - RPR - HIV Antibody (routine testing w rflx) - Babyscripts Schedule Optimization  2. H/O:  C-section - No records from C/S received after sending record request  - Patient reports having a C/S at full term for FTP past 5cm  - Educated and discussed in detail r/b of repeat C/S vs TOLAC, patient undecided at this time of which option she desires  - Additional information given to patient in AVS  - Patient needs next appointment with MD to decide and sign consent if she chooses to [facility] or set up appointment for C/S   3. Hemoglobin C trait ([facility]) - Culture, OB Urine  Preterm labor symptoms and general obstetric precautions including but not limited to vaginal bleeding, contractions, leaking of fluid and fetal movement were reviewed in detail with the patient. Please refer to After Visit Summary for other counseling recommendations.   Return in about 4 weeks (around 02/10/2019) for ROB-with MD/discuss VBAC vs repeat .  Future Appointments  Date Time Provider [place]  02/10/2019  9:30 AM Sloan Leiter, MD [place] None    [name], CNM

## 2019-01-13 NOTE — Progress Notes (Signed)
Tdap declined

## 2019-01-14 LAB — CBC
Hematocrit: 36 % (ref 34.0–46.6)
Hemoglobin: 12 g/dL (ref 11.1–15.9)
MCH: 29.8 pg (ref 26.6–33.0)
MCHC: 33.3 g/dL (ref 31.5–35.7)
MCV: 89 fL (ref 79–97)
Platelets: 357 10*3/uL (ref 150–450)
RBC: 4.03 x10E6/uL (ref 3.77–5.28)
RDW: 13.2 % (ref 11.7–15.4)
WBC: 8.7 10*3/uL (ref 3.4–10.8)

## 2019-01-14 LAB — GLUCOSE TOLERANCE, 2 HOURS W/ 1HR
Glucose, 1 hour: 118 mg/dL (ref 65–179)
Glucose, 2 hour: 83 mg/dL (ref 65–152)
Glucose, Fasting: 78 mg/dL (ref 65–91)

## 2019-01-14 LAB — HIV ANTIBODY (ROUTINE TESTING W REFLEX): HIV Screen 4th Generation wRfx: NONREACTIVE

## 2019-01-14 LAB — RPR: RPR Ser Ql: NONREACTIVE

## 2019-01-15 LAB — URINE CULTURE, OB REFLEX

## 2019-01-15 LAB — CULTURE, OB URINE

## 2019-02-10 ENCOUNTER — Ambulatory Visit (INDEPENDENT_AMBULATORY_CARE_PROVIDER_SITE_OTHER): Payer: Medicaid Other | Admitting: Certified Nurse Midwife

## 2019-02-10 ENCOUNTER — Encounter: Payer: Self-pay | Admitting: Certified Nurse Midwife

## 2019-02-10 ENCOUNTER — Other Ambulatory Visit: Payer: Self-pay

## 2019-02-10 VITALS — BP 127/85 | HR 95 | Wt 234.0 lb

## 2019-02-10 DIAGNOSIS — Z3A31 31 weeks gestation of pregnancy: Secondary | ICD-10-CM

## 2019-02-10 DIAGNOSIS — Z3483 Encounter for supervision of other normal pregnancy, third trimester: Secondary | ICD-10-CM

## 2019-02-10 DIAGNOSIS — Z3480 Encounter for supervision of other normal pregnancy, unspecified trimester: Secondary | ICD-10-CM

## 2019-02-10 DIAGNOSIS — Z98891 History of uterine scar from previous surgery: Secondary | ICD-10-CM

## 2019-02-10 NOTE — Patient Instructions (Signed)

## 2019-02-10 NOTE — Progress Notes (Signed)
Patient reports fetal movement with occasional pelvic and back pressure.

## 2019-02-10 NOTE — Progress Notes (Signed)
   PRENATAL VISIT NOTE  Subjective:  Leslie Barrett is a 23 y.o. [phone] at [redacted]w[redacted]d being seen today for ongoing prenatal care. 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  Patient reports occasional braxton hicks contractions. Reports due to not drinking enough water.  Vag. Bleeding: None.  Movement: Present. Denies leaking of fluid.   The following portions of the patient's history were reviewed and updated as appropriate: allergies, current medications, past family history, past medical history, past social history, past surgical history and problem list.   Objective:   Vitals:   02/10/19 0943  BP: 127/85  Pulse: 95  Weight: 106.1 kg    Fetal Status: Fetal Heart Rate (bpm): 137 Fundal Height: 32 cm Movement: Present     General:  Alert, oriented and cooperative. Patient is in no acute distress.  Skin: Skin is warm and dry. No rash noted.   Cardiovascular: Normal heart rate noted  Respiratory: Normal respiratory effort, no problems with respiration noted  Abdomen: Soft, gravid, appropriate for gestational age.  Pain/Pressure: Present     Pelvic: Cervical exam deferred        Extremities: Normal range of motion.  Edema: None  Mental Status: Normal mood and affect. Normal behavior. Normal judgment and thought content.   Assessment and Plan:  Pregnancy: G4P1021 at [redacted]w[redacted]d 1. Supervision of other normal pregnancy, antepartum -Routine prenatal care -Educated on increasing water intake to help decrease braxton hicks and uterine irritability -Anticipatory guidance for upcoming appointments -Collect GBS and GC/CT at 36 week visit  2. H/O: C-section -Would like repeat c-section -Needs appointment with MD to discuss c-section  Preterm labor symptoms and  general obstetric precautions including but not limited to vaginal bleeding, contractions, leaking of fluid and fetal movement were reviewed in detail with the patient. Please refer to After Visit Summary for other counseling recommendations.   Return in about 4 weeks (around 03/10/2019) for ROB/GBS.  Future Appointments  Date Time Provider [place]  02/24/2019 10:30 AM Constant, Vickii Chafe, MD [facility] None  03/10/2019  9:45 AM [name], CNM [facility] None    Maryagnes Amos, SNM

## 2019-02-24 ENCOUNTER — Ambulatory Visit (INDEPENDENT_AMBULATORY_CARE_PROVIDER_SITE_OTHER): Payer: Medicaid Other | Admitting: Obstetrics and Gynecology

## 2019-02-24 ENCOUNTER — Encounter: Payer: Self-pay | Admitting: Obstetrics and Gynecology

## 2019-02-24 ENCOUNTER — Other Ambulatory Visit: Payer: Self-pay

## 2019-02-24 VITALS — BP 132/81 | HR 87 | Wt 232.6 lb

## 2019-02-24 DIAGNOSIS — Z98891 History of uterine scar from previous surgery: Secondary | ICD-10-CM

## 2019-02-24 DIAGNOSIS — O26893 Other specified pregnancy related conditions, third trimester: Secondary | ICD-10-CM

## 2019-02-24 DIAGNOSIS — R87810 Cervical high risk human papillomavirus (HPV) DNA test positive: Secondary | ICD-10-CM

## 2019-02-24 DIAGNOSIS — R87610 Atypical squamous cells of undetermined significance on cytologic smear of cervix (ASC-US): Secondary | ICD-10-CM

## 2019-02-24 DIAGNOSIS — Z3A33 33 weeks gestation of pregnancy: Secondary | ICD-10-CM

## 2019-02-24 DIAGNOSIS — O34219 Maternal care for unspecified type scar from previous cesarean delivery: Secondary | ICD-10-CM

## 2019-02-24 DIAGNOSIS — Z3480 Encounter for supervision of other normal pregnancy, unspecified trimester: Secondary | ICD-10-CM

## 2019-02-24 NOTE — Progress Notes (Signed)
Pt is here for ROB. [redacted]w[redacted]d.

## 2019-02-24 NOTE — Progress Notes (Signed)
   PRENATAL VISIT NOTE  Subjective:  Leslie Barrett is a 23 y.o. [phone] at [redacted]w[redacted]d being seen today for ongoing prenatal care.  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  Patient reports no complaints.  Contractions: Irritability. Vag. Bleeding: None.  Movement: Present. Denies leaking of fluid.   The following portions of the patient's history were reviewed and updated as appropriate: allergies, current medications, past family history, past medical history, past social history, past surgical history and problem list.   Objective:   Vitals:   02/24/19 1028  BP: 132/81  Pulse: 87  Weight: 232 lb 9.6 oz (105.5 kg)    Fetal Status: Fetal Heart Rate (bpm): 135   Movement: Present     General:  Alert, oriented and cooperative. Patient is in no acute distress.  Skin: Skin is warm and dry. No rash noted.   Cardiovascular: Normal heart rate noted  Respiratory: Normal respiratory effort, no problems with respiration noted  Abdomen: Soft, gravid, appropriate for gestational age.  Pain/Pressure: Present     Pelvic: Cervical exam deferred        Extremities: Normal range of motion.  Edema: None  Mental Status: Normal mood and affect. Normal behavior. Normal judgment and thought content.   Assessment and Plan:  Pregnancy: G4P1021 at [redacted]w[redacted]d 1. Supervision of other normal pregnancy, antepartum Patient is doing well without complaints  2. H/O: C-section Patient desires repeat c-section Will be scheduled at 39 weeks  3. ASCUS with positive high risk HPV cervical colpo pp  Preterm labor symptoms and general obstetric precautions including but not limited to vaginal bleeding, contractions, leaking of fluid and fetal movement were reviewed in detail with the  patient. Please refer to After Visit Summary for other counseling recommendations.   Return in about 2 weeks (around 03/10/2019) for in person, ROB/gbs.  Future Appointments  Date Time Provider [place]  03/10/2019  9:45 AM [name], CNM [facility] None    Mora Bellman, MD

## 2019-03-10 ENCOUNTER — Other Ambulatory Visit (HOSPITAL_COMMUNITY)
Admission: RE | Admit: 2019-03-10 | Discharge: 2019-03-10 | Disposition: A | Discharge status: Home / Self Care | Payer: Medicaid Other | Source: Ambulatory Visit | Attending: Certified Nurse Midwife | Admitting: Certified Nurse Midwife

## 2019-03-10 ENCOUNTER — Other Ambulatory Visit: Payer: Self-pay

## 2019-03-10 ENCOUNTER — Ambulatory Visit (INDEPENDENT_AMBULATORY_CARE_PROVIDER_SITE_OTHER): Payer: Medicaid Other | Admitting: Certified Nurse Midwife

## 2019-03-10 ENCOUNTER — Encounter: Payer: Self-pay | Admitting: Certified Nurse Midwife

## 2019-03-10 ENCOUNTER — Other Ambulatory Visit: Payer: Self-pay | Admitting: Certified Nurse Midwife

## 2019-03-10 VITALS — BP 114/77 | HR 83 | Wt 234.3 lb

## 2019-03-10 DIAGNOSIS — Z3480 Encounter for supervision of other normal pregnancy, unspecified trimester: Secondary | ICD-10-CM | POA: Insufficient documentation

## 2019-03-10 DIAGNOSIS — Z3483 Encounter for supervision of other normal pregnancy, third trimester: Secondary | ICD-10-CM

## 2019-03-10 DIAGNOSIS — Z3A35 35 weeks gestation of pregnancy: Secondary | ICD-10-CM

## 2019-03-10 DIAGNOSIS — Z98891 History of uterine scar from previous surgery: Secondary | ICD-10-CM

## 2019-03-10 NOTE — Progress Notes (Addendum)
   PRENATAL VISIT NOTE  Subjective:  Leslie Barrett is a 23 y.o. [phone] at [redacted]w[redacted]d being seen today for ongoing prenatal care.  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  Patient reports no complaints.  Contractions: Irritability. Vag. Bleeding: None.  Movement: Present. Denies leaking of fluid.   The following portions of the patient's history were reviewed and updated as appropriate: allergies, current medications, past family history, past medical history, past social history, past surgical history and problem list.   Objective:   Vitals:   03/10/19 0937  BP: 114/77  Pulse: 83  Weight: 234 lb 4.8 oz (106.3 kg)    Fetal Status: Fetal Heart Rate (bpm): 154   Movement: Present  Presentation: Vertex  General:  Alert, oriented and cooperative. Patient is in no acute distress.  Skin: Skin is warm and dry. No rash noted.   Cardiovascular: Normal heart rate noted  Respiratory: Normal respiratory effort, no problems with respiration noted  Abdomen: Soft, gravid, appropriate for gestational age.  Pain/Pressure: Present     Pelvic: Cervical exam performed Dilation: Fingertip Effacement (%): Thick Station: -3  Extremities: Normal range of motion.  Edema: None  Mental Status: Normal mood and affect. Normal behavior. Normal judgment and thought content.   Assessment and Plan:  Pregnancy: G4P1021 at [redacted]w[redacted]d 1. Supervision of other normal pregnancy, antepartum - Patient doing well, no complaints - Anticipatory guidance on upcoming appointments - Routine prenatal care - Reviewed safety, visitor policy, reassurance about COVID-19 for pregnancy at this time. Discussed possible changes to visits, including televisits, that may occur due to COVID-19.  The office remains  open if pt needs to be seen and MAU is open 24 hours/day for OB emergencies. - Strep Gp B NAA - Cervicovaginal ancillary only( [place])  2. H/O: C-section - Plans for repeat section, scheduled for October 10th   Preterm labor symptoms and general obstetric precautions including but not limited to vaginal bleeding, contractions, leaking of fluid and fetal movement were reviewed in detail with the patient. Please refer to After Visit Summary for other counseling recommendations.   Return in about 11 days (around 03/21/2019) for ROB-mychart.  Future Appointments  Date Time Provider [place]  03/21/2019 10:00 AM Shelly Bombard, MD [place] None    [name], CNM

## 2019-03-10 NOTE — Patient Instructions (Signed)
Reasons to go to MAU:  1.  Contractions are  5 minutes apart or less, each last 1 minute, these have been going on for 1-2 hours, and you cannot walk or talk during them 2.  You have a large gush of fluid, or a trickle of fluid that will not stop and you have to wear a pad 3.  You have bleeding that is bright red, heavier than spotting--like menstrual bleeding (spotting can be normal in early labor or after a check of your cervix) 4.  You do not feel the baby moving like he/she normally does  

## 2019-03-10 NOTE — Progress Notes (Signed)
ROB/GBS. Reports no problems today. 

## 2019-03-12 LAB — STREP GP B NAA: Strep Gp B NAA: POSITIVE — AB

## 2019-03-12 LAB — CERVICOVAGINAL ANCILLARY ONLY
Chlamydia: NEGATIVE
Neisseria Gonorrhea: NEGATIVE

## 2019-03-14 ENCOUNTER — Telehealth: Payer: Self-pay

## 2019-03-14 NOTE — Telephone Encounter (Signed)
Return call to pt regarding pain and upcoming C-Section. Comfort measures discussed. Pt voiced understanding.

## 2019-03-16 ENCOUNTER — Encounter (HOSPITAL_COMMUNITY): Payer: Self-pay | Admitting: *Deleted

## 2019-03-16 ENCOUNTER — Inpatient Hospital Stay (HOSPITAL_COMMUNITY)
Admission: AD | Admit: 2019-03-16 | Discharge: 2019-03-16 | Disposition: A | Discharge status: Home / Self Care | Payer: Medicaid Other | Attending: Obstetrics and Gynecology | Admitting: Obstetrics and Gynecology

## 2019-03-16 ENCOUNTER — Telehealth: Payer: Self-pay

## 2019-03-16 ENCOUNTER — Other Ambulatory Visit: Payer: Self-pay

## 2019-03-16 DIAGNOSIS — B373 Candidiasis of vulva and vagina: Secondary | ICD-10-CM | POA: Diagnosis not present

## 2019-03-16 DIAGNOSIS — Z79899 Other long term (current) drug therapy: Secondary | ICD-10-CM | POA: Diagnosis not present

## 2019-03-16 DIAGNOSIS — O34219 Maternal care for unspecified type scar from previous cesarean delivery: Secondary | ICD-10-CM

## 2019-03-16 DIAGNOSIS — O479 False labor, unspecified: Secondary | ICD-10-CM

## 2019-03-16 DIAGNOSIS — Z3480 Encounter for supervision of other normal pregnancy, unspecified trimester: Secondary | ICD-10-CM

## 2019-03-16 DIAGNOSIS — O4703 False labor before 37 completed weeks of gestation, third trimester: Secondary | ICD-10-CM | POA: Diagnosis not present

## 2019-03-16 DIAGNOSIS — O99343 Other mental disorders complicating pregnancy, third trimester: Secondary | ICD-10-CM | POA: Diagnosis not present

## 2019-03-16 DIAGNOSIS — Z3689 Encounter for other specified antenatal screening: Secondary | ICD-10-CM

## 2019-03-16 DIAGNOSIS — O98813 Other maternal infectious and parasitic diseases complicating pregnancy, third trimester: Secondary | ICD-10-CM | POA: Diagnosis not present

## 2019-03-16 DIAGNOSIS — Z3A36 36 weeks gestation of pregnancy: Secondary | ICD-10-CM | POA: Diagnosis not present

## 2019-03-16 DIAGNOSIS — B3731 Acute candidiasis of vulva and vagina: Secondary | ICD-10-CM

## 2019-03-16 DIAGNOSIS — F419 Anxiety disorder, unspecified: Secondary | ICD-10-CM | POA: Diagnosis not present

## 2019-03-16 DIAGNOSIS — Z98891 History of uterine scar from previous surgery: Secondary | ICD-10-CM

## 2019-03-16 HISTORY — DX: Anxiety disorder, unspecified: F41.9

## 2019-03-16 LAB — WET PREP, GENITAL
Sperm: NONE SEEN
Trich, Wet Prep: NONE SEEN

## 2019-03-16 MED ORDER — COMFORT FIT MATERNITY SUPP LG MISC: 1.0000 [IU] | Freq: Every day | 0 refills | Status: DC

## 2019-03-16 MED ORDER — FLUCONAZOLE 150 MG PO TABS: 150.0000 mg | ORAL_TABLET | Freq: Every day | ORAL | 0 refills | Status: DC

## 2019-03-16 NOTE — MAU Note (Signed)
Last night noted some spotting around 2030.  Started having contractions.  Has continued to contract off and on since. Really hurts, can't catch her breath.  Some d/c, no leaking.  Denies any placental issues on [place].

## 2019-03-16 NOTE — Telephone Encounter (Signed)
Patient called and reports light spotting and contractions over night. Pt reports good fetal movement, she reports feeling like the baby is lower and extreme pressure. I advised patient to go to MAU for evaluation. Pt verbalizes understanding.

## 2019-03-16 NOTE — MAU Provider Note (Signed)
History     CSN: [id number]  Arrival date and time: 03/16/19 1259   First Bransen Fassnacht Initiated Contact with Patient 03/16/19 1410     Chief Complaint  Patient presents with  . Contractions  . Vaginal Itching   Leslie Barrett is a 23 y.o. G4P1 at [redacted]w[redacted]d who presents to MAU with complaints of contractions and vaginal itching. She reports having occasional contractions that occurs every 5-10 for the past 2 days. Patient reports that she is just "uncomfortable", rates pain 6/10. She has a repeat C/S scheduled for 10/11. She reports waking up this morning to vaginal itching and irritation. Symptoms went away then returned this morning. Has next prenatal appointment scheduled for Monday.    OB History    Gravida  4   Para  1   Term  1   Preterm      AB  2   Living  1     SAB  1   TAB      Ectopic  1   Multiple      Live Births  1           Past Medical History:  Diagnosis Date  . Anxiety   . H/O chlamydia infection   . Medical history non-contributory     Past Surgical History:  Procedure Laterality Date  . CESAREAN SECTION      Family History  Problem Relation Age of Onset  . Diabetes Maternal Grandmother   . Diabetes Paternal Grandmother     Social History   Tobacco Use  . Smoking status: Never Smoker  . Smokeless tobacco: Never Used  Substance Use Topics  . Alcohol use: Not Currently    Frequency: Never    Comment: 1-2 times per week - wine  . Drug use: No    Allergies: No Known Allergies  Medications Prior to Admission  Medication Sig Dispense Refill Last Dose  . Prenatal Vit-Fe Fumarate-FA (MULTIVITAMIN-PRENATAL) 27-0.8 MG TABS tablet Take 1 tablet by mouth daily at 12 noon.   03/16/2019 at 0800  . sertraline (ZOLOFT) 50 MG tablet Take 1 tablet (50 mg total) by mouth daily. 30 tablet 5 03/16/2019 at 0800    Review of Systems  Constitutional: Negative.   Respiratory: Negative.   Cardiovascular: Negative.   Gastrointestinal: Positive for  abdominal pain. Negative for constipation, diarrhea, nausea and vomiting.       Contractions   Genitourinary: Negative for difficulty urinating, dysuria, frequency, urgency and vaginal discharge.       Vaginal irritation and itching   Musculoskeletal: Negative.   Neurological: Negative.    Physical Exam   Blood pressure 115/76, pulse (!) 108, temperature 98 F (36.7 C), temperature source Oral, resp. rate 18, height 5\' 5"  (1.651 m), weight 106.1 kg, last menstrual period 07/03/2018, SpO2 100 %, unknown if currently breastfeeding.  Physical Exam  Nursing note and vitals reviewed. Constitutional: She is oriented to person, place, and time. She appears well-developed and well-nourished. No distress.  Cardiovascular: Regular rhythm and normal heart sounds.  Respiratory: Effort normal and breath sounds normal. No respiratory distress. She has no wheezes.  GI: Soft. There is no abdominal tenderness. There is no rebound.  Mild contractions palpated, gravid   Genitourinary:    Genitourinary Comments: Blind swabs collected, cervical examination performed, no vaginal bleeding    Musculoskeletal: Normal range of motion.        General: No edema.  Neurological: She is alert and oriented to person, place, and time.  Psychiatric: She has a normal mood and affect. Her behavior is normal. Thought content normal.   Dilation: Fingertip Effacement (%): Thick Station: Ballotable Presentation: Vertex Exam by:: Wende Bushy CNM  FHR: 130/ moderate/ +accels/ no decelerations  Toco: UI with irregular UC   MAU Course  Procedures  MDM Orders Placed This Encounter  Procedures  . Wet prep, genital   Patient reports only drinking 1-2 bottles of water per day, encouraged patient to drink 6-8 bottles per day  Wet prep pending  UI noted on monitor with occasional contractions- pitcher of water given to patient in MAU   Results for orders placed or performed during the hospital encounter of 03/16/19 (from  the past 24 hour(s))  Wet prep, genital     Status: Abnormal   Collection Time: 03/16/19  2:30 PM   Specimen: Vaginal  Result Value Ref Range   Yeast Wet Prep HPF POC PRESENT (A) NONE SEEN   Trich, Wet Prep NONE SEEN NONE SEEN   Clue Cells Wet Prep HPF POC PRESENT (A) NONE SEEN   WBC, Wet Prep HPF POC MANY (A) NONE SEEN   Sperm NONE SEEN    Recheck cervix in 1 hour  Patient denies having a maternity support belt at home to help with discomfort  After hydration with water UI resolved and patient had mild irregular UC, no cervical change on reassessment   Encouraged the patient to rest and hydrate at home, discussed reasons to return to MAU, follow up as scheduled in the office for prenatal care. Rx for diflucan sent to pharmacy of choice for yeast infection. Pt stable at time of discharge.   Assessment and Plan   1. Labor, false (Braxton-Hicks), antepartum   2. H/O: C-section   3. Supervision of other normal pregnancy, antepartum   4. [redacted] weeks gestation of pregnancy   5. NST (non-stress test) reactive   6. Previous cesarean delivery affecting pregnancy   7. Vulvovaginal candidiasis    Discharge home Follow up as scheduled for prenatal appointments Return to MAU as needed  Labor precautions and FKC  Hydration and rest  Rx for maternity support belt printed for patient   Follow-up [place] Follow up.   Specialty: Obstetrics and Gynecology Why: Follow up as scheduled for prenatal appointments  Contact information: [address], [place] [phone]         Allergies as of 03/16/2019   No Known Allergies     Medication List    TAKE these medications   Comfort Fit Maternity Supp Lg Misc 1 Units by Does not apply route daily.   fluconazole 150 MG tablet Commonly known as: Diflucan Take 1 tablet (150 mg total) by mouth daily.   multivitamin-prenatal 27-0.8 MG Tabs tablet Take 1  tablet by mouth daily at 12 noon.   sertraline 50 MG tablet Commonly known as: Zoloft Take 1 tablet (50 mg total) by mouth daily.      [name] CNM 03/16/2019, 3:26 PM

## 2019-03-16 NOTE — Discharge Instructions (Signed)

## 2019-03-21 ENCOUNTER — Telehealth (INDEPENDENT_AMBULATORY_CARE_PROVIDER_SITE_OTHER): Payer: Medicaid Other | Admitting: Obstetrics

## 2019-03-21 ENCOUNTER — Encounter (HOSPITAL_COMMUNITY): Payer: Self-pay

## 2019-03-21 ENCOUNTER — Encounter: Payer: Self-pay | Admitting: Obstetrics

## 2019-03-21 VITALS — BP 113/75

## 2019-03-21 DIAGNOSIS — Z3480 Encounter for supervision of other normal pregnancy, unspecified trimester: Secondary | ICD-10-CM

## 2019-03-21 DIAGNOSIS — Z3A37 37 weeks gestation of pregnancy: Secondary | ICD-10-CM

## 2019-03-21 DIAGNOSIS — Z3483 Encounter for supervision of other normal pregnancy, third trimester: Secondary | ICD-10-CM

## 2019-03-21 DIAGNOSIS — Z98891 History of uterine scar from previous surgery: Secondary | ICD-10-CM

## 2019-03-21 NOTE — Patient Instructions (Signed)
Leslie Barrett  03/21/2019   Your procedure is scheduled on:  04/03/2019  Arrive at 70 at [facility] on [address] at [facility]  and [employer] [employer] [address]. You are invited to use the FREE valet parking or use the Visitor's parking deck.  Pick up the phone at the desk and dial [phone].  Call this number if you have problems the morning of surgery: [phone]  Remember:   Do not eat food:(After Midnight) Desps de medianoche.  Do not drink clear liquids: (After Midnight) Desps de medianoche.  Take these medicines the morning of surgery with A SIP OF WATER:  zoloft   Do not wear jewelry, make-up or nail polish.  Do not wear lotions, powders, or perfumes. Do not wear deodorant.  Do not shave 48 hours prior to surgery.  Do not bring valuables to the hospital.  [facility] is not   responsible for any belongings or valuables brought to the hospital.  Contacts, dentures or bridgework may not be worn into surgery.  Leave suitcase in the car. After surgery it may be brought to your room.  For patients admitted to the hospital, checkout time is 11:00 AM the day of              discharge.      Please read over the following fact sheets that you were given:     Preparing for Surgery

## 2019-03-21 NOTE — Progress Notes (Signed)
   TELEHEALTH VIRTUAL OBSTETRICS VISIT ENCOUNTER NOTE  I connected with Leslie Barrett on 03/21/19 at 10:00 AM EDT by telephone at home and verified that I am speaking with the correct person using two identifiers.   I discussed the limitations, risks, security and privacy concerns of performing an evaluation and management service by telephone and the availability of in person appointments. I also discussed with the patient that there may be a patient responsible charge related to this service. The patient expressed understanding and agreed to proceed.  Subjective:  Leslie Barrett is a 23 y.o. [phone] at [redacted]w[redacted]d being followed for ongoing prenatal care.  She is currently monitored for the following issues for this low-risk pregnancy and has Supervision of other normal pregnancy, antepartum; H/O: C-section; H/O chlamydia infection; ASCUS with positive high risk HPV cervical; and Hemoglobin C trait ([place]) on their problem list.  Patient reports no complaints. Reports fetal movement. Denies any contractions, bleeding or leaking of fluid.   The following portions of the patient's history were reviewed and updated as appropriate: allergies, current medications, past family history, past medical history, past social history, past surgical history and problem list.   Objective:   General:  Alert, oriented and cooperative.   Mental Status: Normal mood and affect perceived. Normal judgment and thought content.  Rest of physical exam deferred due to type of encounter  Assessment and Plan:  Pregnancy: G4P1021 at [redacted]w[redacted]d 1. Supervision of other normal pregnancy, antepartum  2. H/O: C-section - repeat C/S scheduled at 39 weeks  Term labor symptoms and general obstetric precautions including but not limited to vaginal bleeding, contractions, leaking of fluid and fetal movement were reviewed in detail with the patient.  I discussed the assessment and treatment plan with the patient. The patient was provided  an opportunity to ask questions and all were answered. The patient agreed with the plan and demonstrated an understanding of the instructions. The patient was advised to call back or seek an in-person office evaluation/go to MAU at [facility] for any urgent or concerning symptoms. Please refer to After Visit Summary for other counseling recommendations.   I provided 10 minutes of non-face-to-face time during this encounter.  Return in about 1 week (around 03/28/2019) for MyChart.  No future appointments.  Baltazar Najjar, MD [facility] for [facility], [facility] 03/21/2019

## 2019-03-21 NOTE — Progress Notes (Signed)
Pt is on the phone preparing for virtual visit with provider. [redacted]w[redacted]d.  

## 2019-03-28 ENCOUNTER — Telehealth (INDEPENDENT_AMBULATORY_CARE_PROVIDER_SITE_OTHER): Payer: Medicaid Other | Admitting: Obstetrics

## 2019-03-28 ENCOUNTER — Encounter: Payer: Self-pay | Admitting: Obstetrics

## 2019-03-28 DIAGNOSIS — Z3480 Encounter for supervision of other normal pregnancy, unspecified trimester: Secondary | ICD-10-CM

## 2019-03-28 DIAGNOSIS — O99820 Streptococcus B carrier state complicating pregnancy: Secondary | ICD-10-CM

## 2019-03-28 DIAGNOSIS — Z3A38 38 weeks gestation of pregnancy: Secondary | ICD-10-CM

## 2019-03-28 DIAGNOSIS — Z98891 History of uterine scar from previous surgery: Secondary | ICD-10-CM

## 2019-03-28 NOTE — Progress Notes (Signed)
   [place] VIRTUAL VIDEO VISIT ENCOUNTER NOTE  Provider location: [facility] for [place] [facility] [place]   I connected with Leslie Barrett on 03/28/19 at 10:00 AM EDT by OB MyChart Video Encounter at home and verified that I am speaking with the correct person using two identifiers.   I discussed the limitations, risks, security and privacy concerns of performing an evaluation and management service virtually and the availability of in person appointments. I also discussed with the patient that there may be a patient responsible charge related to this service. The patient expressed understanding and agreed to proceed. Subjective:  Leslie Barrett is a 23 y.o. [phone] at [redacted]w[redacted]d being seen today for ongoing prenatal care.  She is currently monitored for the following issues for this low-risk pregnancy and has Supervision of other normal pregnancy, antepartum; H/O: C-section; H/O chlamydia infection; ASCUS with positive high risk HPV cervical; and Hemoglobin C trait ([place]) on their problem list.  Patient reports no complaints.  Contractions: Not present. Vag. Bleeding: None.  Movement: Present. Denies any leaking of fluid.   The following portions of the patient's history were reviewed and updated as appropriate: allergies, current medications, past family history, past medical history, past social history, past surgical history and problem list.   Objective:  There were no vitals filed for this visit.  Fetal Status:     Movement: Present     General:  Alert, oriented and cooperative. Patient is in no acute distress.  Respiratory: Normal respiratory effort, no problems with respiration noted  Mental Status: Normal mood and affect. Normal behavior. Normal judgment and thought content.  Rest of physical exam deferred due to type of encounter  Imaging: No results found.  Assessment and Plan:  Pregnancy: G4P1021 at [redacted]w[redacted]d 1. Supervision of other normal pregnancy, antepartum   2. H/O: C-section - elective repeat C/S scheduled for 04-03-2019  3. GBS (group B Streptococcus carrier), +RV culture, currently pregnant   Term labor symptoms and general obstetric precautions including but not limited to vaginal bleeding, contractions, leaking of fluid and fetal movement were reviewed in detail with the patient. I discussed the assessment and treatment plan with the patient. The patient was provided an opportunity to ask questions and all were answered. The patient agreed with the plan and demonstrated an understanding of the instructions. The patient was advised to call back or seek an in-person office evaluation/go to MAU at [facility] for any urgent or concerning symptoms. Please refer to After Visit Summary for other counseling recommendations.   I provided 10 minutes of face-to-face time during this encounter.  Return for Will follow up postpartum after C/S on 04-03-2019.  Future Appointments  Date Time Provider [place]  04/01/2019  8:30 AM [facility]-MAU 1 [facility] None    Baltazar Najjar, [place] for [facility], [facility] 03/28/2019

## 2019-03-28 NOTE — Progress Notes (Signed)
The patient is on the phone preparing for virtual visit with provider. [redacted]w[redacted]d. Pt reports she is unable to check her BP with cuff due to not being at home. Pt denies HA's, blurry vision, or sharp abdominal pain.

## 2019-04-01 ENCOUNTER — Other Ambulatory Visit (HOSPITAL_COMMUNITY)
Admission: RE | Admit: 2019-04-01 | Discharge: 2019-04-01 | Disposition: A | Discharge status: Home / Self Care | Payer: Medicaid Other | Source: Ambulatory Visit | Attending: Obstetrics and Gynecology | Admitting: Obstetrics and Gynecology

## 2019-04-01 ENCOUNTER — Other Ambulatory Visit: Payer: Self-pay

## 2019-04-01 DIAGNOSIS — Z01812 Encounter for preprocedural laboratory examination: Secondary | ICD-10-CM | POA: Diagnosis not present

## 2019-04-01 DIAGNOSIS — Z20828 Contact with and (suspected) exposure to other viral communicable diseases: Secondary | ICD-10-CM | POA: Diagnosis not present

## 2019-04-01 LAB — CBC
HCT: 32.8 % — ABNORMAL LOW (ref 36.0–46.0)
Hemoglobin: 11.9 g/dL — ABNORMAL LOW (ref 12.0–15.0)
MCH: 29.6 pg (ref 26.0–34.0)
MCHC: 36.3 g/dL — ABNORMAL HIGH (ref 30.0–36.0)
MCV: 81.6 fL (ref 80.0–100.0)
Platelets: 371 10*3/uL (ref 150–400)
RBC: 4.02 MIL/uL (ref 3.87–5.11)
RDW: 13.5 % (ref 11.5–15.5)
WBC: 8.4 10*3/uL (ref 4.0–10.5)
nRBC: 0 % (ref 0.0–0.2)

## 2019-04-01 LAB — ABO/RH: ABO/RH(D): O POS

## 2019-04-01 LAB — SARS CORONAVIRUS 2 BY RT PCR (HOSPITAL ORDER, PERFORMED IN ~~LOC~~ HOSPITAL LAB): SARS Coronavirus 2: NEGATIVE

## 2019-04-01 LAB — TYPE AND SCREEN
ABO/RH(D): O POS
Antibody Screen: NEGATIVE

## 2019-04-01 LAB — RPR: RPR Ser Ql: NONREACTIVE

## 2019-04-01 NOTE — MAU Note (Signed)
Pt reported no symptoms and tolerated swab.  

## 2019-04-02 NOTE — Anesthesia Preprocedure Evaluation (Addendum)
Anesthesia Evaluation  Patient identified by MRN, date of birth, ID band Patient awake    Reviewed: Allergy & Precautions, NPO status , Patient's Chart, lab work & pertinent test results  Airway Mallampati: I  TM Distance: >3 FB Neck ROM: Full    Dental no notable dental hx. (+) Teeth Intact   Pulmonary neg pulmonary ROS,    Pulmonary exam normal breath sounds clear to auscultation       Cardiovascular Exercise Tolerance: Good negative cardio ROS Normal cardiovascular exam Rhythm:Regular Rate:Normal     Neuro/Psych negative neurological ROS  negative psych ROS   GI/Hepatic negative GI ROS, Neg liver ROS,   Endo/Other  negative endocrine ROS  Renal/GU negative Renal ROS     Musculoskeletal negative musculoskeletal ROS (+)   Abdominal (+) + obese,   Peds  Hematology Hgb 11.9 Plt 371  T&S avail  O pos   Anesthesia Other Findings   Reproductive/Obstetrics (+) Pregnancy                            Anesthesia Physical Anesthesia Plan  ASA: III  Anesthesia Plan: Spinal   Post-op Pain Management:    Induction:   PONV Risk Score and Plan: 3 and Treatment may vary due to age or medical condition, Ondansetron and Dexamethasone  Airway Management Planned: Nasal Cannula and Natural Airway  Additional Equipment:   Intra-op Plan:   Post-operative Plan:   Informed Consent: I have reviewed the patients History and Physical, chart, labs and discussed the procedure including the risks, benefits and alternatives for the proposed anesthesia with the patient or authorized representative who has indicated his/her understanding and acceptance.     Dental advisory given  Plan Discussed with: CRNA  Anesthesia Plan Comments: (39 wk G4P1 for repeat C/S under spinal)       Anesthesia Quick Evaluation

## 2019-04-03 ENCOUNTER — Encounter (HOSPITAL_COMMUNITY): Payer: Self-pay | Admitting: *Deleted

## 2019-04-03 ENCOUNTER — Inpatient Hospital Stay (HOSPITAL_COMMUNITY): Payer: Medicaid Other | Admitting: Anesthesiology

## 2019-04-03 ENCOUNTER — Other Ambulatory Visit: Payer: Self-pay

## 2019-04-03 ENCOUNTER — Encounter (HOSPITAL_COMMUNITY)
Admission: AD | Disposition: A | Discharge status: Home / Self Care | Payer: Self-pay | Source: Home / Self Care | Attending: Obstetrics & Gynecology

## 2019-04-03 ENCOUNTER — Inpatient Hospital Stay (HOSPITAL_COMMUNITY)
Admission: AD | Admit: 2019-04-03 | Discharge: 2019-04-04 | DRG: 788 | Disposition: A | Discharge status: Home / Self Care | Payer: Medicaid Other | Attending: Obstetrics & Gynecology | Admitting: Obstetrics & Gynecology

## 2019-04-03 DIAGNOSIS — F329 Major depressive disorder, single episode, unspecified: Secondary | ICD-10-CM | POA: Diagnosis present

## 2019-04-03 DIAGNOSIS — O34211 Maternal care for low transverse scar from previous cesarean delivery: Secondary | ICD-10-CM | POA: Diagnosis present

## 2019-04-03 DIAGNOSIS — D649 Anemia, unspecified: Secondary | ICD-10-CM | POA: Diagnosis present

## 2019-04-03 DIAGNOSIS — Z20828 Contact with and (suspected) exposure to other viral communicable diseases: Secondary | ICD-10-CM | POA: Diagnosis present

## 2019-04-03 DIAGNOSIS — O99824 Streptococcus B carrier state complicating childbirth: Secondary | ICD-10-CM | POA: Diagnosis present

## 2019-04-03 DIAGNOSIS — O34219 Maternal care for unspecified type scar from previous cesarean delivery: Secondary | ICD-10-CM

## 2019-04-03 DIAGNOSIS — O99344 Other mental disorders complicating childbirth: Secondary | ICD-10-CM | POA: Diagnosis present

## 2019-04-03 DIAGNOSIS — Z3480 Encounter for supervision of other normal pregnancy, unspecified trimester: Secondary | ICD-10-CM

## 2019-04-03 DIAGNOSIS — O9902 Anemia complicating childbirth: Secondary | ICD-10-CM | POA: Diagnosis present

## 2019-04-03 DIAGNOSIS — Z3A39 39 weeks gestation of pregnancy: Secondary | ICD-10-CM

## 2019-04-03 DIAGNOSIS — Z98891 History of uterine scar from previous surgery: Secondary | ICD-10-CM

## 2019-04-03 SURGERY — Surgical Case
Anesthesia: Spinal

## 2019-04-03 MED ORDER — SOD CITRATE-CITRIC ACID 500-334 MG/5ML PO SOLN
ORAL | Status: AC
  Filled 2019-04-03: qty 15

## 2019-04-03 MED ORDER — SCOPOLAMINE 1 MG/3DAYS TD PT72
1.0000 | MEDICATED_PATCH | Freq: Once | TRANSDERMAL | Status: DC
  Administered 2019-04-03: 1.5 mg via TRANSDERMAL

## 2019-04-03 MED ORDER — MORPHINE SULFATE (PF) 0.5 MG/ML IJ SOLN
INTRAMUSCULAR | Status: AC
  Filled 2019-04-03: qty 10

## 2019-04-03 MED ORDER — OXYTOCIN 40 UNITS IN NORMAL SALINE INFUSION - SIMPLE MED: 2.5000 [IU]/h | INTRAVENOUS | Status: AC

## 2019-04-03 MED ORDER — LACTATED RINGERS IV SOLN: INTRAVENOUS | Status: DC

## 2019-04-03 MED ORDER — DIPHENHYDRAMINE HCL 25 MG PO CAPS: 25.0000 mg | ORAL_CAPSULE | ORAL | Status: DC | PRN

## 2019-04-03 MED ORDER — ENOXAPARIN SODIUM 60 MG/0.6ML ~~LOC~~ SOLN
50.0000 mg | SUBCUTANEOUS | Status: DC
  Administered 2019-04-04: 50 mg via SUBCUTANEOUS
  Filled 2019-04-03: qty 0.6

## 2019-04-03 MED ORDER — SIMETHICONE 80 MG PO CHEW: 80.0000 mg | CHEWABLE_TABLET | Freq: Three times a day (TID) | ORAL | Status: DC

## 2019-04-03 MED ORDER — PHENYLEPHRINE HCL-NACL 20-0.9 MG/250ML-% IV SOLN
INTRAVENOUS | Status: DC | PRN
  Administered 2019-04-03: 60 ug/min via INTRAVENOUS

## 2019-04-03 MED ORDER — LACTATED RINGERS IV SOLN
INTRAVENOUS | Status: DC
  Administered 2019-04-03 – 2019-04-04 (×2): via INTRAVENOUS

## 2019-04-03 MED ORDER — PRENATAL MULTIVITAMIN CH: 1.0000 | ORAL_TABLET | Freq: Every day | ORAL | Status: DC

## 2019-04-03 MED ORDER — DIPHENHYDRAMINE HCL 50 MG/ML IJ SOLN
INTRAMUSCULAR | Status: AC
  Filled 2019-04-03: qty 1

## 2019-04-03 MED ORDER — PHENYLEPHRINE HCL-NACL 20-0.9 MG/250ML-% IV SOLN
INTRAVENOUS | Status: AC
  Filled 2019-04-03: qty 250

## 2019-04-03 MED ORDER — IBUPROFEN 800 MG PO TABS
800.0000 mg | ORAL_TABLET | Freq: Four times a day (QID) | ORAL | Status: DC
  Administered 2019-04-04 (×2): 800 mg via ORAL
  Filled 2019-04-03 (×2): qty 1

## 2019-04-03 MED ORDER — ZOLPIDEM TARTRATE 5 MG PO TABS: 5.0000 mg | ORAL_TABLET | Freq: Every evening | ORAL | Status: DC | PRN

## 2019-04-03 MED ORDER — SODIUM CHLORIDE 0.9% FLUSH: 3.0000 mL | INTRAVENOUS | Status: DC | PRN

## 2019-04-03 MED ORDER — WITCH HAZEL-GLYCERIN EX PADS: 1.0000 "application " | MEDICATED_PAD | CUTANEOUS | Status: DC | PRN

## 2019-04-03 MED ORDER — NALOXONE HCL 4 MG/10ML IJ SOLN
1.0000 ug/kg/h | INTRAVENOUS | Status: DC | PRN
  Filled 2019-04-03: qty 5

## 2019-04-03 MED ORDER — SODIUM CHLORIDE 0.9 % IV SOLN
INTRAVENOUS | Status: DC | PRN
  Administered 2019-04-03: 10:00:00 via INTRAVENOUS

## 2019-04-03 MED ORDER — DIBUCAINE (PERIANAL) 1 % EX OINT: 1.0000 "application " | TOPICAL_OINTMENT | CUTANEOUS | Status: DC | PRN

## 2019-04-03 MED ORDER — ONDANSETRON HCL 4 MG/2ML IJ SOLN: 4.0000 mg | Freq: Three times a day (TID) | INTRAMUSCULAR | Status: DC | PRN

## 2019-04-03 MED ORDER — EPHEDRINE 5 MG/ML INJ
INTRAVENOUS | Status: AC
  Filled 2019-04-03: qty 10

## 2019-04-03 MED ORDER — MEASLES, MUMPS & RUBELLA VAC IJ SOLR: 0.5000 mL | Freq: Once | INTRAMUSCULAR | Status: DC

## 2019-04-03 MED ORDER — OXYTOCIN 40 UNITS IN NORMAL SALINE INFUSION - SIMPLE MED
INTRAVENOUS | Status: AC
  Filled 2019-04-03: qty 1000

## 2019-04-03 MED ORDER — KETOROLAC TROMETHAMINE 30 MG/ML IJ SOLN
30.0000 mg | Freq: Four times a day (QID) | INTRAMUSCULAR | Status: AC
  Administered 2019-04-03 – 2019-04-04 (×4): 30 mg via INTRAVENOUS
  Filled 2019-04-03 (×4): qty 1

## 2019-04-03 MED ORDER — NALBUPHINE HCL 10 MG/ML IJ SOLN
5.0000 mg | Freq: Once | INTRAMUSCULAR | Status: DC | PRN
  Filled 2019-04-03: qty 0.5

## 2019-04-03 MED ORDER — PHENYLEPHRINE 40 MCG/ML (10ML) SYRINGE FOR IV PUSH (FOR BLOOD PRESSURE SUPPORT)
PREFILLED_SYRINGE | INTRAVENOUS | Status: DC | PRN
  Administered 2019-04-03: 80 ug via INTRAVENOUS

## 2019-04-03 MED ORDER — DIPHENHYDRAMINE HCL 25 MG PO CAPS: 25.0000 mg | ORAL_CAPSULE | Freq: Four times a day (QID) | ORAL | Status: DC | PRN

## 2019-04-03 MED ORDER — SENNOSIDES-DOCUSATE SODIUM 8.6-50 MG PO TABS
2.0000 | ORAL_TABLET | ORAL | Status: DC
  Administered 2019-04-04: 2 via ORAL
  Filled 2019-04-03: qty 2

## 2019-04-03 MED ORDER — SIMETHICONE 80 MG PO CHEW
80.0000 mg | CHEWABLE_TABLET | Freq: Three times a day (TID) | ORAL | Status: DC
  Administered 2019-04-03 – 2019-04-04 (×4): 80 mg via ORAL
  Filled 2019-04-03 (×3): qty 1

## 2019-04-03 MED ORDER — ENOXAPARIN SODIUM 40 MG/0.4ML ~~LOC~~ SOLN: 40.0000 mg | SUBCUTANEOUS | Status: DC

## 2019-04-03 MED ORDER — NALBUPHINE HCL 10 MG/ML IJ SOLN
5.0000 mg | INTRAMUSCULAR | Status: DC | PRN
  Filled 2019-04-03: qty 0.5

## 2019-04-03 MED ORDER — COCONUT OIL OIL: 1.0000 "application " | TOPICAL_OIL | Status: DC | PRN

## 2019-04-03 MED ORDER — SOD CITRATE-CITRIC ACID 500-334 MG/5ML PO SOLN
30.0000 mL | Freq: Once | ORAL | Status: AC
  Administered 2019-04-03: 30 mL via ORAL

## 2019-04-03 MED ORDER — TETANUS-DIPHTH-ACELL PERTUSSIS 5-2.5-18.5 LF-MCG/0.5 IM SUSP: 0.5000 mL | Freq: Once | INTRAMUSCULAR | Status: DC

## 2019-04-03 MED ORDER — SIMETHICONE 80 MG PO CHEW: 80.0000 mg | CHEWABLE_TABLET | ORAL | Status: DC | PRN

## 2019-04-03 MED ORDER — SIMETHICONE 80 MG PO CHEW: 80.0000 mg | CHEWABLE_TABLET | ORAL | Status: DC

## 2019-04-03 MED ORDER — SENNOSIDES-DOCUSATE SODIUM 8.6-50 MG PO TABS: 2.0000 | ORAL_TABLET | ORAL | Status: DC

## 2019-04-03 MED ORDER — NALOXONE HCL 0.4 MG/ML IJ SOLN: 0.4000 mg | INTRAMUSCULAR | Status: DC | PRN

## 2019-04-03 MED ORDER — ONDANSETRON HCL 4 MG/2ML IJ SOLN
INTRAMUSCULAR | Status: DC | PRN
  Administered 2019-04-03: 4 mg via INTRAVENOUS

## 2019-04-03 MED ORDER — FENTANYL CITRATE (PF) 100 MCG/2ML IJ SOLN
INTRAMUSCULAR | Status: AC
  Filled 2019-04-03: qty 2

## 2019-04-03 MED ORDER — LACTATED RINGERS IV SOLN
INTRAVENOUS | Status: DC | PRN
  Administered 2019-04-03 (×2): via INTRAVENOUS

## 2019-04-03 MED ORDER — PRENATAL MULTIVITAMIN CH
1.0000 | ORAL_TABLET | Freq: Every day | ORAL | Status: DC
  Administered 2019-04-04: 1 via ORAL
  Filled 2019-04-03: qty 1

## 2019-04-03 MED ORDER — MENTHOL 3 MG MT LOZG: 1.0000 | LOZENGE | OROMUCOSAL | Status: DC | PRN

## 2019-04-03 MED ORDER — SODIUM CHLORIDE 0.9 % IV SOLN
INTRAVENOUS | Status: DC | PRN
  Administered 2019-04-03: 40 [IU] via INTRAVENOUS

## 2019-04-03 MED ORDER — MORPHINE SULFATE (PF) 0.5 MG/ML IJ SOLN
INTRAMUSCULAR | Status: DC | PRN
  Administered 2019-04-03: .15 mg via INTRATHECAL

## 2019-04-03 MED ORDER — GABAPENTIN 100 MG PO CAPS
100.0000 mg | ORAL_CAPSULE | Freq: Two times a day (BID) | ORAL | Status: DC
  Administered 2019-04-04 (×2): 100 mg via ORAL
  Filled 2019-04-03 (×2): qty 1

## 2019-04-03 MED ORDER — OXYTOCIN 40 UNITS IN NORMAL SALINE INFUSION - SIMPLE MED: 2.5000 [IU]/h | INTRAVENOUS | Status: DC

## 2019-04-03 MED ORDER — SIMETHICONE 80 MG PO CHEW
80.0000 mg | CHEWABLE_TABLET | ORAL | Status: DC
  Administered 2019-04-04: 80 mg via ORAL
  Filled 2019-04-03: qty 1

## 2019-04-03 MED ORDER — BUPIVACAINE IN DEXTROSE 0.75-8.25 % IT SOLN
INTRATHECAL | Status: DC | PRN
  Administered 2019-04-03: 1.6 mL via INTRATHECAL

## 2019-04-03 MED ORDER — SCOPOLAMINE 1 MG/3DAYS TD PT72
MEDICATED_PATCH | TRANSDERMAL | Status: AC
  Filled 2019-04-03: qty 1

## 2019-04-03 MED ORDER — OXYCODONE HCL 5 MG PO TABS
5.0000 mg | ORAL_TABLET | ORAL | Status: DC | PRN
  Administered 2019-04-03: 10 mg via ORAL
  Administered 2019-04-04 (×2): 5 mg via ORAL
  Filled 2019-04-03: qty 2
  Filled 2019-04-03 (×2): qty 1

## 2019-04-03 MED ORDER — DIPHENHYDRAMINE HCL 50 MG/ML IJ SOLN
12.5000 mg | INTRAMUSCULAR | Status: DC | PRN
  Administered 2019-04-03: 12.5 mg via INTRAVENOUS

## 2019-04-03 MED ORDER — CEFAZOLIN SODIUM-DEXTROSE 2-4 GM/100ML-% IV SOLN
2.0000 g | INTRAVENOUS | Status: AC
  Administered 2019-04-03: 2 g via INTRAVENOUS

## 2019-04-03 MED ORDER — EPHEDRINE SULFATE 50 MG/ML IJ SOLN
INTRAMUSCULAR | Status: DC | PRN
  Administered 2019-04-03 (×2): 10 mg via INTRAVENOUS

## 2019-04-03 MED ORDER — FENTANYL CITRATE (PF) 100 MCG/2ML IJ SOLN
INTRAMUSCULAR | Status: DC | PRN
  Administered 2019-04-03: 15 ug via INTRATHECAL

## 2019-04-03 MED ORDER — CEFAZOLIN SODIUM-DEXTROSE 2-4 GM/100ML-% IV SOLN
INTRAVENOUS | Status: AC
  Filled 2019-04-03: qty 100

## 2019-04-03 MED ORDER — ACETAMINOPHEN 325 MG PO TABS
650.0000 mg | ORAL_TABLET | ORAL | Status: DC | PRN
  Administered 2019-04-04: 650 mg via ORAL
  Filled 2019-04-03: qty 2

## 2019-04-03 MED ORDER — ONDANSETRON HCL 4 MG/2ML IJ SOLN
INTRAMUSCULAR | Status: AC
  Filled 2019-04-03: qty 2

## 2019-04-03 SURGICAL SUPPLY — 32 items
BENZOIN TINCTURE PRP APPL 2/3 (GAUZE/BANDAGES/DRESSINGS) ×2 IMPLANT
CHLORAPREP W/TINT 26ML (MISCELLANEOUS) ×2 IMPLANT
CLAMP CORD UMBIL (MISCELLANEOUS) IMPLANT
CLOSURE STERI STRIP 1/2 X4 (GAUZE/BANDAGES/DRESSINGS) ×2 IMPLANT
CLOTH BEACON ORANGE TIMEOUT ST (SAFETY) ×2 IMPLANT
DRSG OPSITE POSTOP 4X10 (GAUZE/BANDAGES/DRESSINGS) ×2 IMPLANT
ELECT REM PT RETURN 9FT ADLT (ELECTROSURGICAL) ×2
ELECTRODE REM PT RTRN 9FT ADLT (ELECTROSURGICAL) ×1 IMPLANT
EXTRACTOR VACUUM M CUP 4 TUBE (SUCTIONS) IMPLANT
GAUZE SPONGE 4X4 12PLY STRL LF (GAUZE/BANDAGES/DRESSINGS) ×4 IMPLANT
GLOVE BIOGEL PI IND STRL 7.0 (GLOVE) ×3 IMPLANT
GLOVE BIOGEL PI INDICATOR 7.0 (GLOVE) ×3
GLOVE ECLIPSE 7.0 STRL STRAW (GLOVE) ×2 IMPLANT
GOWN STRL REUS W/TWL LRG LVL3 (GOWN DISPOSABLE) ×4 IMPLANT
KIT ABG SYR 3ML LUER SLIP (SYRINGE) IMPLANT
NEEDLE HYPO 22GX1.5 SAFETY (NEEDLE) ×2 IMPLANT
NEEDLE HYPO 25X5/8 SAFETYGLIDE (NEEDLE) ×2 IMPLANT
NS IRRIG 1000ML POUR BTL (IV SOLUTION) ×2 IMPLANT
PACK C SECTION WH (CUSTOM PROCEDURE TRAY) ×2 IMPLANT
PAD ABD 7.5X8 STRL (GAUZE/BANDAGES/DRESSINGS) ×4 IMPLANT
PAD OB MATERNITY 4.3X12.25 (PERSONAL CARE ITEMS) ×2 IMPLANT
PENCIL SMOKE EVAC W/HOLSTER (ELECTROSURGICAL) ×2 IMPLANT
RTRCTR C-SECT PINK 25CM LRG (MISCELLANEOUS) IMPLANT
SUT PDS AB 0 CTX 36 PDP370T (SUTURE) IMPLANT
SUT PLAIN 2 0 XLH (SUTURE) IMPLANT
SUT VIC AB 0 CTX 36 (SUTURE) ×3
SUT VIC AB 0 CTX36XBRD ANBCTRL (SUTURE) ×3 IMPLANT
SUT VIC AB 4-0 KS 27 (SUTURE) ×2 IMPLANT
SYR CONTROL 10ML LL (SYRINGE) ×2 IMPLANT
TOWEL OR 17X24 6PK STRL BLUE (TOWEL DISPOSABLE) ×2 IMPLANT
TRAY FOLEY W/BAG SLVR 14FR LF (SET/KITS/TRAYS/PACK) ×2 IMPLANT
WATER STERILE IRR 1000ML POUR (IV SOLUTION) ×2 IMPLANT

## 2019-04-03 NOTE — Progress Notes (Signed)
Mom has been encouraged to drink more liquids since shift report at 1915; she has only drank 80 ml in 3 hours--output is minimum at 75 mls.  Promised to drink at least 3 cups of water in the next hour after explained to her why drinking is so important to get kidneys functioning rather than relying only on IV fluids.

## 2019-04-03 NOTE — Op Note (Addendum)
[name] PROCEDURE DATE: 04/03/2019  PREOPERATIVE DIAGNOSES: Intrauterine pregnancy at [redacted]w[redacted]d weeks gestation; elective repeat  POSTOPERATIVE DIAGNOSES: The same  PROCEDURE: Repeat Low Transverse Cesarean Section  SURGEON:  Dr. Verita Schneiders  ASSISTANTS:  Phill Myron, DO & Barrington Ellison, MD  ANESTHESIOLOGY TEAM: Anesthesiologist: Barnet Glasgow, MD CRNA: Hewitt Blade, CRNA  INDICATIONS: Leslie Barrett is a 23 y.o. [phone] at [redacted]w[redacted]d here for cesarean section secondary to the indications listed under preoperative diagnoses; please see preoperative note for further details.  The risks of cesarean section were discussed with the patient including but were not limited to: bleeding which may require transfusion or reoperation; infection which may require antibiotics; injury to bowel, bladder, ureters or other surrounding organs; injury to the fetus; need for additional procedures including hysterectomy in the event of a life-threatening hemorrhage; placental abnormalities wth subsequent pregnancies, incisional problems, thromboembolic phenomenon and other postoperative/anesthesia complications.   The patient concurred with the proposed plan, giving informed written consent for the procedure.    FINDINGS:  Viable female infant in cephalic presentation. Clear amniotic fluid.  Intact placenta, three vessel cord.  Normal uterus, fallopian tubes and ovaries bilaterally. Rectus muscle adhered to anterior uterine wall. APGAR (1 MIN): 8   APGAR (5 MINS): 9   APGAR (10 MINS):    ANESTHESIA: Spinal INTRAVENOUS FLUIDS: 2600 ml   ESTIMATED BLOOD LOSS: 778 ml URINE OUTPUT:  100 ml SPECIMENS: Placenta sent to L&D COMPLICATIONS: None immediate  PROCEDURE IN DETAIL:  The patient preoperatively received intravenous antibiotics and had sequential compression devices applied to her lower extremities.  She was then taken to the operating room where spinal anesthesia was administered and was  found to be adequate. She was then placed in a dorsal supine position with a leftward tilt, and prepped and draped in a sterile manner.  A foley catheter was placed into her bladder and attached to constant gravity.  After an adequate timeout was performed, a Pfannenstiel skin incision was made with scalpel on her preexisting scarand carried through to the underlying layer of fascia. The fascia was incised in the midline, and this incision was extended bilaterally using the Mayo scissors.  Kocher clamps were applied to the superior aspect of the fascial incision and the underlying rectus muscles were dissected off bluntly and sharply.  A similar process was carried out on the inferior aspect of the fascial incision. The rectus muscles were separated in the midline and the peritoneum was entered bluntly. Rectus muscle densely adhered to anterior uterine wall and taken down with Bovie. Rectus muscles partially dissected laterally with Bovie.The Lesieli self-retaining retractor was introduced into the abdominal cavity.  Attention was turned to the lower uterine segment where a low transverse hysterotomy was made with a scalpel and extended bilaterally bluntly.  The infant was successfully delivered, the cord was clamped and cut after one minute, and the infant was handed over to the awaiting neonatology team. Uterine massage was then administered, and the placenta delivered intact with a three-vessel cord. The uterus was then cleared of clots and debris.  The hysterotomy was closed with 0 Vicryl in a running locked fashion, and an imbricating layer was also placed with 0 Vicryl.  Figure-of-eight 0 Vicryl serosal stitches were placed to help with hemostasis.  The pelvis was cleared of all clot and debris. Hemostasis was confirmed on all surfaces.  The retractor was removed. The fascia was then closed using 0 PDS in a running fashion.  The subcutaneous layer was irrigated, reapproximated  with 2-0 plain gut interrupted  stitches, and the skin was closed with a 4-0 Vicryl subcuticular stitch. The patient tolerated the procedure well. Sponge, instrument and needle counts were correct x 3.  She was taken to the recovery room in stable condition.   Leslie Birkenhead, MD [facility] Family Medicine Fellow, [facility] for [employer], [facility]

## 2019-04-03 NOTE — Anesthesia Postprocedure Evaluation (Signed)
Anesthesia Post Note  Patient: Leslie Barrett  Procedure(s) Performed: REPEAT CESAREAN SECTION (N/A )     Patient location during evaluation: Mother Baby Anesthesia Type: Spinal Level of consciousness: oriented and awake and alert Pain management: pain level controlled Vital Signs Assessment: post-procedure vital signs reviewed and stable Respiratory status: spontaneous breathing and respiratory function stable Cardiovascular status: blood pressure returned to baseline and stable Postop Assessment: no headache, no backache, no apparent nausea or vomiting and able to ambulate Anesthetic complications: no    Last Vitals:  Vitals:   04/03/19 1045 04/03/19 1100  BP: 120/73 126/78  Pulse: 73 79  Resp: 20 (!) 32  Temp:    SpO2: 100% 99%    Last Pain:  Vitals:   04/03/19 1100  TempSrc:   PainSc: 4    Pain Goal:    LLE Motor Response: Purposeful movement (04/03/19 1100) LLE Sensation: Tingling (04/03/19 1100) RLE Motor Response: Purposeful movement (04/03/19 1100) RLE Sensation: Tingling (04/03/19 1100) L Sensory Level: L5-Outer lower leg, top of foot, great toe (04/03/19 1100) R Sensory Level: L5-Outer lower leg, top of foot, great toe (04/03/19 1100) Epidural/Spinal Function Cutaneous sensation: Tingles (04/03/19 1100), Patient able to flex knees: No (04/03/19 1100), Patient able to lift hips off bed: No (04/03/19 1100), Back pain beyond tenderness at insertion site: No (04/03/19 1100), Progressively worsening motor and/or sensory loss: No (04/03/19 1100), Bowel and/or bladder incontinence post epidural: No (04/03/19 1100)  Barnet Glasgow

## 2019-04-03 NOTE — Progress Notes (Signed)
Spoke with Dr. Valma Cava regarding incisional pain. Verbal order received to administer prn order of 10mg  of oxycodone at this time.

## 2019-04-03 NOTE — Anesthesia Procedure Notes (Signed)
Spinal  Patient location during procedure: OB Start time: 04/03/2019 8:50 AM End time: 04/03/2019 8:57 AM Staffing Anesthesiologist: Barnet Glasgow, MD Performed: anesthesiologist  Preanesthetic Checklist Completed: patient identified, surgical consent, pre-op evaluation, timeout performed, IV checked, risks and benefits discussed and monitors and equipment checked Spinal Block Patient position: sitting Prep: site prepped and draped and DuraPrep Patient monitoring: heart rate, cardiac monitor, continuous pulse ox and blood pressure Approach: midline Location: L3-4 Injection technique: single-shot Needle Needle type: Pencan  Needle gauge: 24 G Needle length: 10 cm Needle insertion depth: 6 cm Assessment Sensory level: T4 Additional Notes 1 Attempt (s). Pt tolerated procedure well.

## 2019-04-03 NOTE — H&P (Signed)
Obstetric Preoperative History and Physical  Leslie Barrett is a 23 y.o. [phone] with IUP at [redacted]w[redacted]d presenting for scheduled cesarean section.  No acute concerns.   Prenatal Course Source of Care: [facility]  with onset of care at 16 weeks Pregnancy complications or risks: Depression, on Zoloft  Patient Active Problem List   Diagnosis Date Noted  . 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 ASCUS with positive high risk HPV cervical 11/01/2018  . H/O: C-section 10/28/2018  . H/O chlamydia infection   . Supervision of other normal pregnancy, antepartum 10/27/2018   She plans to bottle feed She desires NuvaRing vaginal inserts for postpartum contraception.   Prenatal labs and studies: ABO, Rh: --/--/O POS, O POS Performed at [facility], [place] [address] [place], [place] [address]  [phone]) Antibody: NEG (10/09 0931) Rubella: 1.00 (05/07 0853) RPR: NON REACTIVE (10/09 0931)  HBsAg: Negative (05/07 0853)  HIV: Non Reactive (07/23 0954)  GBS:--/Positive (09/17 1005) 2 hr Glucola: Normal  Genetic screening normal Anatomy US normal  Prenatal Transfer Tool  Maternal Diabetes: No Genetic Screening: Normal Maternal Ultrasounds/Referrals: Normal Fetal Ultrasounds or other Referrals:  None Maternal Substance Abuse:  No Significant Maternal Medications:  None Significant Maternal Lab Results: Group B Strep positive  Past Medical History:  Diagnosis Date  . Anxiety   . H/O chlamydia infection   . Medical history non-contributory     Past Surgical History:  Procedure Laterality Date  . CESAREAN SECTION      OB History  Gravida Para Term Preterm AB Living  4 1 1   2 1   SAB TAB Ectopic Multiple Live Births  1   1   1     # Outcome Date GA Lbr Len/2nd Weight Sex Delivery Anes PTL Lv  4 Current           3 Term 03/10/17    F CS-LTranv Spinal  LIV  2 Ectopic           1 SAB             Social History   Socioeconomic History  . Marital status: Single    Spouse name:  Not on file  . Number of children: Not on file  . Years of education: Not on file  . Highest education level: Not on file  Occupational History  . Not on file  Social Needs  . Financial resource strain: Not hard at all  . Food insecurity    Worry: Never true    Inability: Never true  . Transportation needs    Medical: No    Non-medical: No  Tobacco Use  . Smoking status: Never Smoker  . Smokeless tobacco: Never Used  Substance and Sexual Activity  . Alcohol use: Not Currently    Frequency: Never    Comment: 1-2 times per week - wine  . Drug use: No  . Sexual activity: Yes    Birth control/protection: None  Lifestyle  . Physical activity    Days per week: Not on file    Minutes per session: Not on file  . Stress: Not on file  Relationships  . Social Herbalist on phone: Not on file    Gets together: Not on file    Attends religious service: Not on file    Active member of club or organization: Not on file    Attends meetings of clubs or organizations: Not on file    Relationship status: Not  on file  Other Topics Concern  . Not on file  Social History Narrative  . Not on file    Family History  Problem Relation Age of Onset  . Diabetes Maternal Grandmother   . Diabetes Paternal Grandmother     Medications Prior to Admission  Medication Sig Dispense Refill Last Dose  . Prenatal Vit-Fe Fumarate-FA (MULTIVITAMIN-PRENATAL) 27-0.8 MG TABS tablet Take 1 tablet by mouth daily at 12 noon.   04/02/2019 at Unknown time  . sertraline (ZOLOFT) 50 MG tablet Take 1 tablet (50 mg total) by mouth daily. 30 tablet 5 04/02/2019 at Unknown time  . Elastic Bandages & Supports (COMFORT FIT MATERNITY SUPP LG) MISC 1 Units by Does not apply route daily. 1 each 0   . fluconazole (DIFLUCAN) 150 MG tablet Take 1 tablet (150 mg total) by mouth daily. (Patient not taking: Reported on 03/21/2019) 1 tablet 0     No Known Allergies  Review of Systems: Negative except for what is  mentioned in HPI.  Physical Exam: BP 119/88   Pulse 85   Temp 98.1 F (36.7 C) (Oral)   Resp 18   Ht 5\' 5"  (1.651 m)   Wt 106.1 kg   LMP 07/03/2018 (Approximate)   BMI 38.94 kg/m  FHR by Doppler: 126 bpm CONSTITUTIONAL: Well-developed, well-nourished female in no acute distress.  HENT:  Normocephalic, atraumatic. Oropharynx is clear and moist EYES: Conjunctivae and EOM are normal. No scleral icterus.  NECK: Normal range of motion, supple SKIN: Skin is warm and dry. No rash noted. Not diaphoretic. No erythema. NEUROLGIC: Alert and oriented to person, place, and time. Normal reflexes, muscle tone coordination. No cranial nerve deficit noted. PSYCHIATRIC: Normal mood and affect. Normal behavior. CARDIOVASCULAR: Normal heart rate noted, regular rhythm RESPIRATORY: Effort and breath sounds normal, no problems with respiration noted ABDOMEN: Soft, nontender, nondistended, gravid. Well-healed Pfannenstiel incision. PELVIC: Deferred MUSCULOSKELETAL: Normal range of motion. No edema and no tenderness. 2+ distal pulses.   Pertinent Labs/Studies:   Results for orders placed or performed during the hospital encounter of 04/01/19 (from the past 72 hour(s))  SARS Coronavirus 2 by RT PCR (hospital order, performed in [facility] hospital lab) Nasopharyngeal Nasopharyngeal Swab     Status: None   Collection Time: 04/01/19  9:24 AM   Specimen: Nasopharyngeal Swab  Result Value Ref Range   SARS Coronavirus 2 NEGATIVE NEGATIVE    Comment: (NOTE) If result is NEGATIVE SARS-CoV-2 target nucleic acids are NOT DETECTED. The SARS-CoV-2 RNA is generally detectable in upper and lower  respiratory specimens during the acute phase of infection. The lowest  concentration of SARS-CoV-2 viral copies this assay can detect is 250  copies / mL. A negative result does not preclude SARS-CoV-2 infection  and should not be used as the sole basis for treatment or other  patient management decisions.  A negative  result may occur with  improper specimen collection / handling, submission of specimen other  than nasopharyngeal swab, presence of viral mutation(s) within the  areas targeted by this assay, and inadequate number of viral copies  (<250 copies / mL). A negative result must be combined with clinical  observations, patient history, and epidemiological information. If result is POSITIVE SARS-CoV-2 target nucleic acids are DETECTED. The SARS-CoV-2 RNA is generally detectable in upper and lower  respiratory specimens dur ing the acute phase of infection.  Positive  results are indicative of active infection with SARS-CoV-2.  Clinical  correlation with patient history and other diagnostic information  is  necessary to determine patient infection status.  Positive results do  not rule out bacterial infection or co-infection with other viruses. If result is PRESUMPTIVE POSTIVE SARS-CoV-2 nucleic acids MAY BE PRESENT.   A presumptive positive result was obtained on the submitted specimen  and confirmed on repeat testing.  While 2019 novel coronavirus  (SARS-CoV-2) nucleic acids may be present in the submitted sample  additional confirmatory testing may be necessary for epidemiological  and / or clinical management purposes  to differentiate between  SARS-CoV-2 and other Sarbecovirus currently known to infect humans.  If clinically indicated additional testing with an alternate test  methodology [id number]) is advised. The SARS-CoV-2 RNA is generally  detectable in upper and lower respiratory sp ecimens during the acute  phase of infection. The expected result is Negative. Fact Sheet for Patients:  BoilerBrush.com.cy Fact Sheet for Healthcare Providers: https://pope.com/ This test is not yet approved or cleared by the [place] FDA and has been authorized for detection and/or diagnosis of SARS-CoV-2 by FDA under an Emergency Use Authorization  (EUA).  This EUA will remain in effect (meaning this test can be used) for the duration of the COVID-19 declaration under Section 564(b)(1) of the Act, 21 U.S.C. section 360bbb-3(b)(1), unless the authorization is terminated or revoked sooner. Performed at [facility], [address] [place], [place] [address]   CBC     Status: Abnormal   Collection Time: 04/01/19  9:31 AM  Result Value Ref Range   WBC 8.4 4.0 - 10.5 K/uL   RBC 4.02 3.87 - 5.11 MIL/uL   Hemoglobin 11.9 (L) 12.0 - 15.0 g/dL   HCT 95.6 (L) 21.3 - 08.6 %   MCV 81.6 80.0 - 100.0 fL   MCH 29.6 26.0 - 34.0 pg   MCHC 36.3 (H) 30.0 - 36.0 g/dL   RDW 57.8 46.9 - 62.9 %   Platelets 371 150 - 400 K/uL   nRBC 0.0 0.0 - 0.2 %    Comment: Performed at [facility], [address], [place], [place] [address]  RPR     Status: None   Collection Time: 04/01/19  9:31 AM  Result Value Ref Range   RPR Ser Ql NON REACTIVE NON REACTIVE    Comment: Performed at [facility], [address] [place], [place] [address]  Type and screen     Status: None   Collection Time: 04/01/19  9:31 AM  Result Value Ref Range   ABO/RH(D) O POS    Antibody Screen NEG    Sample Expiration      04/04/2019,2359 Performed at [facility], [address] [place], [place] [address]   ABO/Rh     Status: None   Collection Time: 04/01/19  9:31 AM  Result Value Ref Range   ABO/RH(D)      O POS Performed at [facility], [address], [place], [place] [address]     Assessment and Plan :Mysty Kielty is a 23 y.o. [phone] at [redacted]w[redacted]d being admitted for scheduled cesarean section. The risks of cesarean section discussed with the patient included but were not limited to: bleeding which may require transfusion or reoperation; infection which may require antibiotics; injury to bowel, bladder, ureters or other surrounding organs; injury to the fetus; need for additional procedures including hysterectomy in the event of a  life-threatening hemorrhage; placental abnormalities wth subsequent pregnancies, incisional problems, thromboembolic phenomenon and other postoperative/anesthesia complications. The patient concurred with the  proposed plan, giving informed written consent for the procedure. Patient has been NPO since last night she will remain NPO for procedure. Anesthesia and OR aware. Preoperative prophylactic antibiotics and SCDs ordered on call to the OR. To OR when ready.    Marcy Sirenatherine , D.O. OB Fellow  04/03/2019, 8:20 AM

## 2019-04-03 NOTE — Discharge Summary (Addendum)
Postpartum Discharge Summary     Patient Name: Leslie Barrett DOB: 01/01/1996 MRN: [id number]  Date of admission: 04/03/2019 Delivering Provider: Chauncey Mann   Date of discharge: 04/04/2019  Admitting diagnosis: REPEAT CESAREAN SECTION Intrauterine pregnancy: [redacted]w[redacted]d    Secondary diagnosis:  Active Problems:   H/O: C-section   [redacted] weeks gestation of pregnancy   Status post repeat low transverse cesarean section  Additional problems: None     Discharge diagnosis: Term Pregnancy Delivered                                                                                                Post partum procedures:None  Augmentation: None  Complications: None  Hospital course:  Sceduled C/S   23y.o. yo [id number] admitted to the hospital 04/03/2019 for scheduled cesarean section with the following indication:Elective Repeat.  Membrane Rupture Time/Date: 9:30 AM ,04/03/2019   Patient delivered a Viable infant.04/03/2019  Details of operation can be found in separate operative note.  Pateint had an uncomplicated postpartum course.  She is ambulating, tolerating a regular diet, passing flatus, and urinating well. Patient requested early discharge home on POD#1, she was discharged home in stable condition on  04/04/19        Delivery time: 9:32 AM    Magnesium Sulfate received: No BMZ received: No Rhophylac:No MMR:No Transfusion:No  Physical exam  Vitals:   04/04/19 0128 04/04/19 0606 04/04/19 0900 04/04/19 1410  BP: (!) 101/58 117/81 111/77 124/77  Pulse: (!) 57 (!) 58 76 77  Resp: _0 Temp: 97.7 F (36.5 C) 98.4 F (36.9 C) 98.2 F (36.8 C) 98.2 F (36.8 C)  TempSrc:   Oral Oral  SpO2: 100% 99% 99% 100%  Weight:      Height:       General: alert, cooperative and no distress Lochia: appropriate Uterine Fundus: firm Incision: Healing well with no significant drainage, No significant erythema, Dressing is clean, dry, and intact DVT Evaluation: No  evidence of DVT seen on physical exam. Labs: Lab Results  Component Value Date   WBC 11.4 (H) 04/04/2019   HGB 9.0 (L) 04/04/2019   HCT 25.3 (L) 04/04/2019   MCV 83.8 04/04/2019   PLT 268 04/04/2019   CMP Latest Ref Rng & Units 04/04/2019  Glucose 65 - 99 mg/dL -  BUN 6 - 20 mg/dL -  Creatinine 0.44 - 1.00 mg/dL 0.98  Sodium 135 - 145 mmol/L -  Potassium 3.5 - 5.1 mmol/L -  Chloride 101 - 111 mmol/L -  CO2 22 - 32 mmol/L -  Calcium 8.9 - 10.3 mg/dL -  Total Protein 6.5 - 8.1 g/dL -  Total Bilirubin 0.3 - 1.2 mg/dL -  Alkaline Phos 38 - 126 U/L -  AST 15 - 41 U/L -  ALT 14 - 54 U/L -    Discharge instruction: per After Visit Summary and "Baby and Me Booklet".  After visit meds:  Allergies as of 04/04/2019   No Known Allergies     Medication List    STOP taking these medications  Comfort Fit Maternity Supp Lg Misc   fluconazole 150 MG tablet Commonly known as: Diflucan     TAKE these medications   acetaminophen 325 MG tablet Commonly known as: TYLENOL Take 2 tablets (650 mg total) by mouth every 4 (four) hours as needed for mild pain (temperature > 101.5.).   etonogestrel-ethinyl estradiol 0.12-0.015 MG/24HR vaginal ring Commonly known as: [place] 1 each vaginally every 21 ( twenty-one) days. Insert one (1) ring vaginally and leave in place for three (3) weeks, then remove for one (1) week.   ibuprofen 800 MG tablet Commonly known as: ADVIL Take 1 tablet (800 mg total) by mouth every 6 (six) hours. Start taking on: April 05, 2019   multivitamin-prenatal 27-0.8 MG Tabs tablet Take 1 tablet by mouth daily at 12 noon.   oxyCODONE 5 MG immediate release tablet Commonly known as: Oxy IR/ROXICODONE Take 1 tablet (5 mg total) by mouth every 6 (six) hours as needed for up to 5 days for moderate pain.   sertraline 50 MG tablet Commonly known as: Zoloft Take 1 tablet (50 mg total) by mouth daily.       Diet: No evidence of DVT seen on physical  exam.  Activity: Advance as tolerated. Pelvic rest for 6 weeks.   Outpatient follow up:4 weeks Follow up Appt: Future Appointments  Date Time Provider [place]  04/18/2019  1:30 PM Woodroe Mode, MD [place] None  05/05/2019  2:00 PM Constant, Vickii Chafe, MD [place] None   Follow up Visit:   Please schedule this patient for Postpartum visit in: 4 weeks with the following provider: Any provider For C/S patients schedule nurse incision check in weeks 2 weeks: yes Low risk pregnancy complicated by: 2 prior sections Delivery mode:  CS Anticipated Birth Control:  Nuvaring PP Procedures needed: Incision check  Schedule Integrated BH visit: no   Patient was extensively counseled on NuvaRing and instructed not to start using the NuvaRing for 6 weeks. Risks of blood clots, decreased milk supply were reviewed. Reviewed need to use backup method for minimum one week after starting Nuvaring.    Newborn Data: Live born female  Birth Weight: 2955 APGAR: 70, 9  Newborn Delivery   Birth date/time: 04/03/2019 09:32:00 Delivery type: C-Section, Low Transverse Trial of labor: No C-section categorization: Repeat      Baby Feeding: Bottle Disposition:home with mother   04/04/2019 Gifford Shave, MD  OB FELLOW ATTESTATION  I have seen and examined this patient and edited the above documentation to reflect any changes/updates.   Augustin Coupe, MD/MPH OB Fellow  04/04/2019, 6:08 PM

## 2019-04-04 ENCOUNTER — Encounter (HOSPITAL_COMMUNITY): Payer: Self-pay | Admitting: Obstetrics & Gynecology

## 2019-04-04 LAB — CBC
HCT: 25.3 % — ABNORMAL LOW (ref 36.0–46.0)
Hemoglobin: 9 g/dL — ABNORMAL LOW (ref 12.0–15.0)
MCH: 29.8 pg (ref 26.0–34.0)
MCHC: 35.6 g/dL (ref 30.0–36.0)
MCV: 83.8 fL (ref 80.0–100.0)
Platelets: 268 10*3/uL (ref 150–400)
RBC: 3.02 MIL/uL — ABNORMAL LOW (ref 3.87–5.11)
RDW: 13.7 % (ref 11.5–15.5)
WBC: 11.4 10*3/uL — ABNORMAL HIGH (ref 4.0–10.5)
nRBC: 0 % (ref 0.0–0.2)

## 2019-04-04 LAB — CREATININE, SERUM
Creatinine, Ser: 0.98 mg/dL (ref 0.44–1.00)
GFR calc Af Amer: >60 mL/min (ref >60)
GFR calc non Af Amer: >60 mL/min (ref >60)

## 2019-04-04 MED ORDER — ACETAMINOPHEN 325 MG PO TABS: 650.0000 mg | ORAL_TABLET | ORAL | Status: DC | PRN

## 2019-04-04 MED ORDER — IBUPROFEN 800 MG PO TABS: 800.0000 mg | ORAL_TABLET | Freq: Four times a day (QID) | ORAL | 0 refills | Status: DC

## 2019-04-04 MED ORDER — OXYCODONE HCL 5 MG PO TABS: 5.0000 mg | ORAL_TABLET | Freq: Four times a day (QID) | ORAL | 0 refills | Status: AC | PRN

## 2019-04-04 MED ORDER — ETONOGESTREL-ETHINYL ESTRADIOL 0.12-0.015 MG/24HR VA RING: 1.0000 | VAGINAL_RING | VAGINAL | 11 refills | Status: DC

## 2019-04-04 NOTE — Progress Notes (Addendum)
POSTPARTUM PROGRESS NOTE  POD #1  Subjective:  Leslie Barrett is a 23 y.o. [id number] s/p rLTCS at [redacted]w[redacted]d.  She reports she doing well. No acute events overnight. She reports she is doing well. She denies any problems with ambulating or PO intake.  Reports she just had her Foley taken out and has not urinated yet.  Denies nausea or vomiting. She has not passed flatus. Pain is moderately controlled.  Patient says that she did not receive any pain medicine this morning because she was sleeping.  Lochia is appropriate.  Objective: Blood pressure 117/81, pulse (!) 58, temperature 98.4 F (36.9 C), resp. rate 16, height 5\' 5"  (1.651 m), weight 106.1 kg, last menstrual period 07/03/2018, SpO2 99 %, unknown if currently breastfeeding.  Physical Exam:  General: alert, cooperative and no distress Chest: no respiratory distress Heart:regular rate, distal pulses intact Abdomen: soft, nontender,  Uterine Fundus: firm, appropriately tender DVT Evaluation: No calf swelling or tenderness Extremities: mild LE edema Skin: warm, dry; incision is covered in bandage but can see honeycomb dressing in place  Recent Labs    04/01/19 0931 04/04/19 0529  HGB 11.9* 9.0*  HCT 32.8* 25.3*    Assessment/Plan: Leslie Barrett is a 23 y.o. [id number] s/p rLTCS at [redacted]w[redacted]d for elective repeat.  POD#1 - Doing welll; pain moderately controlled. H/H appropriate  Routine postpartum care  OOB, ambulated  Lovenox for VTE prophylaxis Anemia: asymptomatic  Contraception: Nuvaring  Feeding: bottle   Dispo: Plan for discharge either late this afternoon or tomorrow.   LOS: 1 day   Gifford Shave, MD  PGY-1, [facility]  04/04/2019, 7:48 AM  GME ATTESTATION:  I saw and evaluated the patient. I agree with the findings and the plan of care as documented in the resident's note.  Merilyn Baba, DO OB Fellow, Faculty Practice 04/04/2019 8:17 PM

## 2019-04-04 NOTE — Clinical Social Work Maternal (Addendum)
CLINICAL SOCIAL WORK MATERNAL/CHILD NOTE  Patient Details  Name: Leslie Barrett MRN: [id number] Date of Birth: 04/06/1996  Date:  2019/04/04  Clinical Social Worker Initiating Note:  Elijio Miles Date/Time: Initiated:  04/04/19/0923     Child's Name:  Leslie Barrett   Biological Parents:  Mother, Father(Peggie Ileana Roup and Jakhiya Brower DOB: 11/22/1994)   Need for Interpreter:  None   Reason for Referral:  Behavioral Health Concerns   Address:  [place] [address]    Phone number:  [phone] (home)     Additional phone number:   Household Members/Support Persons (HM/SP):   Household Member/Support Person 1   HM/SP Name Relationship DOB or Age  HM/SP -1 Jae Dire Daughter 03/10/2017  HM/SP -2        HM/SP -3        HM/SP -4        HM/SP -5        HM/SP -6        HM/SP -7        HM/SP -8          Natural Supports (not living in the home):  Spouse/significant other, Friends, Extended Family   Professional Supports: Other (Comment)(OBNM - Ms. Santiago Glad)   Employment: Full-time   Type of Work: Teaching laboratory technician   Education:  [place] arranged:    Museum/gallery curator Resources:  [employer]   Other Resources:  [employer], Physicist, medical (MOB has [place] for oldest daughter, intends to add infant to plan.)   Cultural/Religious Considerations Which May Impact Care:    Strengths:  Ability to meet basic needs , Home prepared for child , Pediatrician chosen   Psychotropic Medications:         Pediatrician:    [place] area  Pediatrician List:   [place]  [place]      Pediatrician Fax Number:    Risk Factors/Current Problems:  Mental Health Concerns    Cognitive State:  Able to Concentrate , Alert    Mood/Affect:  Calm , Comfortable , Interested    CSW Assessment:  CSW received consult for history of depression.   CSW met with MOB to offer support and complete assessment.    MOB sitting up in bed holding infant with FOB asleep on the couch, when CSW entered the room. CSW introduced self and received verbal permission from MOB to complete assessment with FOB asleep on the couch. CSW explained reason for consult to which MOB expressed understanding. MOB reported she currently lives with her 50-year-old daughter in [place] and works full-time at [employer]. MOB confirmed she receives food stamps and has [place] set up for her oldest daughter. Per MOB, she intends to add infant to her [facility] and was informed on the process of doing so. 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 Per MOB, she noted that she was crying a lot and just really emotional. MOB stated symptoms lasted for quite some time and that "last year was rough". MOB reported she was started on Zoloft when she was experiencing the PPD but stopped taking it. MOB stated she was restarted on Zoloft during her pregnancy and feels she is "progressing". MOB expressed some interest in increasing dose of Zoloft to 75 mg but voiced  concerns with not wanting to feel like a zombie. Per MOB, she intends to talk to her doctor at her first follow up visit on October 26 about increasing dose. CSW aware MOB scored a 2 ("Sometimes") on question 10 ("Thoughts of self-harm") of her [place] Depression Screen. CSW addressed this with MOB and MOB acknowledged having thoughts of self harm in the last 7 days when she's gotten overwhelmed. MOB denied any history of attempts and denied any current thoughts of self-harm, plan or intent. CSW received verbal permission to reach out to [place], [facility], with [facility] to ensure MOB has a follow up visit scheduled. CSW provided education regarding the baby blues period vs. perinatal mood disorders.  CSW recommended self-evaluation during the postpartum time  period using the New Mom Checklist from Postpartum Progress and encouraged MOB to contact a medical professional if symptoms are noted at any time. MOB did not appear to be displaying any acute mental health symptoms and denied any current SI or HI. MOB reported feeling well-supported by FOB, her grandmother, her family and her friends.   CSW aware of note from RN at Initial OB appointment reporting MOB had been using marijuana to cope with her anxiety and depression. CSW inquired about substance use during pregnancy to which MOB acknowledged and confirmed using marijuana to help with her depression. MOB open and honest with CSW and stated she felt a difference in her mood when she was using marijuana. MOB reported that her last use was in May and denied any use since. CSW informed MOB of [place] and explained CDS was pending and that a CPS report would be made, if warranted. MOB denied any questions or concerns regarding policy and denied any previous CPS involvement.   MOB confirmed having all essential items for infant once discharged and reported infant and his own bed to sleep in once home. CSW provided review of Sudden Infant Death Syndrome (SIDS) precautions and safe sleeping habits. CSW offered to make additional referrals for MOB to offer additional support following discharge. MOB declined at this time.    CSW Plan/Description:  No Further Intervention Required/No Barriers to Discharge, Sudden Infant Death Syndrome (SIDS) Education, Perinatal Mood and Anxiety Disorder (PMADs) Education, [place], CSW Will Continue to Monitor Umbilical Cord Tissue Drug Screen Results and Make Report if Foye Spurling, [place] 2019-01-10, 9:54 AM

## 2019-04-04 NOTE — Transfer of Care (Signed)
Immediate Anesthesia Transfer of Care Note  Patient: Leslie Barrett  Procedure(s) Performed: REPEAT CESAREAN SECTION (N/A )  Patient Location: PACU  Anesthesia Type:Spinal  Level of Consciousness: awake, alert  and oriented  Airway & Oxygen Therapy: Patient Spontanous Breathing  Post-op Assessment: Report given to RN and Post -op Vital signs reviewed and stable  Post vital signs: Reviewed and stable  Last Vitals:  Vitals Value Taken Time  BP 117/81 04/04/19 0606  Temp 36.9 C 04/04/19 0606  Pulse 58 04/04/19 0606  Resp 16 04/04/19 0606  SpO2 99 % 04/04/19 0606    Last Pain:  Vitals:   04/04/19 0606  TempSrc:   PainSc: 0-No pain      Patients Stated Pain Goal: 3 (92/11/94 1740)  Complications: No apparent anesthesia complications

## 2019-04-04 NOTE — Progress Notes (Signed)
Social worker notified of Pt with 13 on [place]. Pt also  had a 2 on the question on thoughts of harming yourself. Pt denies any thoughts of self harm at this time or plan on self harm. States she did have some last week but feels better now.

## 2019-04-04 NOTE — Discharge Instructions (Signed)
Congratulations on the new baby!  We have sent all of your prescriptions to your pharmacy.  Remember, do not start using the NuvaRing until 6 weeks after your delivery.   Postpartum Care After Cesarean Delivery This sheet gives you information about how to care for yourself from the time you deliver your baby to up to 6-12 weeks after delivery (postpartum period). Your health care provider may also give you more specific instructions. If you have problems or questions, contact your health care provider. Follow these instructions at home: Medicines  Take over-the-counter and prescription medicines only as told by your health care provider.  If you were prescribed an antibiotic medicine, take it as told by your health care provider. Do not stop taking the antibiotic even if you start to feel better.  Ask your health care provider if the medicine prescribed to you: ? Requires you to avoid driving or using heavy machinery. ? Can cause constipation. You may need to take actions to prevent or treat constipation, such as:  Drink enough fluid to keep your urine pale yellow.  Take over-the-counter or prescription medicines.  Eat foods that are high in fiber, such as beans, whole grains, and fresh fruits and vegetables.  Limit foods that are high in fat and processed sugars, such as fried or sweet foods. Activity  Gradually return to your normal activities as told by your health care provider.  Avoid activities that take a lot of effort and energy (are strenuous) until approved by your health care provider. Walking at a slow to moderate pace is usually safe. Ask your health care provider what activities are safe for you. ? Do not lift anything that is heavier than your baby or 10 lb (4.5 kg) as told by your health care provider. ? Do not vacuum, climb stairs, or drive a car for as long as told by your health care provider.  If possible, have someone help you at home until you are able to do your  usual activities yourself.  Rest as much as possible. Try to rest or take naps while your baby is sleeping. Vaginal bleeding  It is normal to have vaginal bleeding (lochia) after delivery. Wear a sanitary pad to absorb vaginal bleeding and discharge. ? During the first week after delivery, the amount and appearance of lochia is often similar to a menstrual period. ? Over the next few weeks, it will gradually decrease to a dry, yellow-brown discharge. ? For most women, lochia stops completely by 4-6 weeks after delivery. Vaginal bleeding can vary from woman to woman.  Change your sanitary pads frequently. Watch for any changes in your flow, such as: ? A sudden increase in volume. ? A change in color. ? Large blood clots.  If you pass a blood clot, save it and call your health care provider to discuss. Do not flush blood clots down the toilet before you get instructions from your health care provider.  Do not use tampons or douches until your health care provider says this is safe.  If you are not breastfeeding, your period should return 6-8 weeks after delivery. If you are breastfeeding, your period may return anytime between 8 weeks after delivery and the time that you stop breastfeeding. Perineal care   If your C-section (Cesarean section) was unplanned, and you were allowed to labor and push before delivery, you may have pain, swelling, and discomfort of the tissue between your vaginal opening and your anus (perineum). You may also have an incision  in the tissue (episiotomy) or the tissue may have torn during delivery. Follow these instructions as told by your health care provider: ? Keep your perineum clean and dry as told by your health care provider. Use medicated pads and pain-relieving sprays and creams as directed. ? If you have an episiotomy or vaginal tear, check the area every day for signs of infection. Check for:  Redness, swelling, or pain.  Fluid or  blood.  Warmth.  Pus or a bad smell. ? You may be given a squirt bottle to use instead of wiping to clean the perineum area after you go to the bathroom. As you start healing, you may use the squirt bottle before wiping yourself. Make sure to wipe gently. ? To relieve pain caused by an episiotomy, vaginal tear, or hemorrhoids, try taking a warm sitz bath 2-3 times a day. A sitz bath is a warm water bath that is taken while you are sitting down. The water should only come up to your hips and should cover your buttocks. Breast care  Within the first few days after delivery, your breasts may feel heavy, full, and uncomfortable (breast engorgement). You may also have milk leaking from your breasts. Your health care provider can suggest ways to help relieve breast discomfort. Breast engorgement should go away within a few days.  If you are breastfeeding: ? Wear a bra that supports your breasts and fits you well. ? Keep your nipples clean and dry. Apply creams and ointments as told by your health care provider. ? You may need to use breast pads to absorb milk leakage. ? You may have uterine contractions every time you breastfeed for several weeks after delivery. Uterine contractions help your uterus return to its normal size. ? If you have any problems with breastfeeding, work with your health care provider or a Advertising copywriter.  If you are not breastfeeding: ? Avoid touching your breasts as this can make your breasts produce more milk. ? Wear a well-fitting bra and use cold packs to help with swelling. ? Do not squeeze out (express) milk. This causes you to make more milk. Intimacy and sexuality  Ask your health care provider when you can engage in sexual activity. This may depend on your: ? Risk of infection. ? Healing rate. ? Comfort and desire to engage in sexual activity.  You are able to get pregnant after delivery, even if you have not had your period. If desired, talk with your  health care provider about methods of family planning or birth control (contraception). Lifestyle  Do not use any products that contain nicotine or tobacco, such as cigarettes, e-cigarettes, and chewing tobacco. If you need help quitting, ask your health care provider.  Do not drink alcohol, especially if you are breastfeeding. Eating and drinking   Drink enough fluid to keep your urine pale yellow.  Eat high-fiber foods every day. These may help prevent or relieve constipation. High-fiber foods include: ? Whole grain cereals and breads. ? Brown rice. ? Beans. ? Fresh fruits and vegetables.  Take your prenatal vitamins until your postpartum checkup or until your health care provider tells you it is okay to stop. General instructions  Keep all follow-up visits for you and your baby as told by your health care provider. Most women visit their health care provider for a postpartum checkup within the first 3-6 weeks after delivery. Contact a health care provider if you:  Feel unable to cope with the changes that a new baby  brings to your life, and these feelings do not go away.  Feel unusually sad or worried.  Have breasts that are painful, hard, or turn red.  Have a fever.  Have trouble holding urine or keeping urine from leaking.  Have little or no interest in activities you used to enjoy.  Have not breastfed at all and you have not had a menstrual period for 12 weeks after delivery.  Have stopped breastfeeding and you have not had a menstrual period for 12 weeks after you stopped breastfeeding.  Have questions about caring for yourself or your baby.  Pass a blood clot from your vagina. Get help right away if you:  Have chest pain.  Have difficulty breathing.  Have sudden, severe leg pain.  Have severe pain or cramping in your abdomen.  Bleed from your vagina so much that you fill more than one sanitary pad in one hour. Bleeding should not be heavier than your  heaviest period.  Develop a severe headache.  Faint.  Have blurred vision or spots in your vision.  Have a bad-smelling vaginal discharge.  Have thoughts about hurting yourself or your baby. If you ever feel like you may hurt yourself or others, or have thoughts about taking your own life, get help right away. You can go to your nearest emergency department or call:  Your local emergency services (911 in the [place]  A suicide crisis helpline, such as the National Suicide Prevention Lifeline at (248)425-08381-4175324704. This is open 24 hours a day. Summary  The period of time from when you deliver your baby to up to 6-12 weeks after delivery is called the postpartum period.  Gradually return to your normal activities as told by your health care provider.  Keep all follow-up visits for you and your baby as told by your health care provider. This information is not intended to replace advice given to you by your health care provider. Make sure you discuss any questions you have with your health care provider. Document Released: 06/06/2000 Document Revised: 01/27/2018 Document Reviewed: 01/27/2018 Elsevier Patient Education  2020 Elsevier Inc.   Ethinyl Estradiol; Etonogestrel vaginal ring What is this medicine? ETHINYL ESTRADIOL; ETONOGESTREL (ETH in il es tra DYE ole; et oh noe JES trel) vaginal ring is a flexible, vaginal ring used as a contraceptive (birth control method). This medicine combines 2 types of female hormones, an estrogen and a progestin. This ring is used to prevent ovulation and pregnancy. Each ring is effective for 1 month. This medicine may be used for other purposes; ask your health care provider or pharmacist if you have questions. COMMON BRAND NAME(S): EluRyng, NuvaRing What should I tell my health care provider before I take this medicine? They need to know if you have any of these conditions:  abnormal vaginal bleeding  blood vessel disease or blood clots  breast,  cervical, endometrial, ovarian, liver, or uterine cancer  diabetes  gallbladder disease  having surgery  heart disease or recent heart attack  high blood pressure  high cholesterol or triglycerides  history of irregular heartbeat or heart valve problems  kidney disease  liver disease  migraine headaches  protein C deficiency  protein S deficiency  recently had a baby, miscarriage, or abortion  stroke  systemic lupus erythematosus (SLE)  tobacco smoker  your age is more than 25 years old  an unusual or allergic reaction to estrogens, progestins, other medicines, foods, dyes, or preservatives  pregnant or trying to get pregnant  breast-feeding How  should I use this medicine? Insert the ring into your vagina as directed. Follow the directions on the prescription label. The ring will remain place for 3 weeks and is then removed for a 1-week break. A new ring is inserted 1 week after the last ring was removed, on the same day of the week. Check often to make sure the ring is still in place. If the ring was out of the vagina for an unknown amount of time, you may not be protected from pregnancy. Perform a pregnancy test and call your doctor. Do not use more often than directed. A patient package insert for the product will be given with each prescription and refill. Read this sheet carefully each time. The sheet may change frequently. Contact your pediatrician regarding the use of this medicine in children. Special care may be needed. Overdosage: If you think you have taken too much of this medicine contact a poison control center or emergency room at once. NOTE: This medicine is only for you. Do not share this medicine with others. What if I miss a dose? You will need to use the ring exactly as directed. It is very important to follow the schedule every cycle. If you do not use the ring as directed, you may not be protected from pregnancy. If the ring should slip out, is  lost, or if you leave it in longer or shorter than you should, contact your health care professional for advice. What may interact with this medicine? Do not take this medicine with the following medications:  dasabuvir; ombitasvir; paritaprevir; ritonavir  ombitasvir; paritaprevir; ritonavir  vaginal lubricants or other vaginal products that are oil-based or silicone-based This medicine may also interact with the following medications:  acetaminophen  antibiotics or medicines for infections, especially rifampin, rifabutin, rifapentine, and griseofulvin, and possibly penicillins or tetracyclines  aprepitant or fosaprepitant  armodafinil  ascorbic acid (vitamin C)  barbiturate medicines, such as phenobarbital or primidone  bosentan  certain antiviral medicines for hepatitis, HIV or AIDS  certain medicines for cancer treatment  certain medicines for seizures like carbamazepine, clobazam, felbamate, lamotrigine, oxcarbazepine, phenytoin, rufinamide, topiramate  certain medicines for treating high cholesterol  cyclosporine  dantrolene  elagolix  flibanserin  grapefruit juice  lesinurad  medicines for diabetes  medicines to treat fungal infections, such as griseofulvin, miconazole, fluconazole, ketoconazole, itraconazole, posaconazole or voriconazole  mifepristone  mitotane  modafinil  morphine  mycophenolate  St. Glynna Failla's wort  tamoxifen  temazepam  theophylline or aminophylline  thyroid hormones  tizanidine  tranexamic acid  ulipristal  warfarin This list may not describe all possible interactions. Give your health care provider a list of all the medicines, herbs, non-prescription drugs, or dietary supplements you use. Also tell them if you smoke, drink alcohol, or use illegal drugs. Some items may interact with your medicine. What should I watch for while using this medicine? Visit your doctor or health care professional for regular checks on  your progress. You will need a regular breast and pelvic exam and Pap smear while on this medicine. Check with your doctor or health care professional to see if you need an additional method of contraception during the first cycle that you use this ring. Female condoms (made with natural rubber latex, polyisoprene, and polyurethane) and spermicides may be used. Do not use a diaphragm, cervical cap, or a female condom, as the ring can interfere with these birth control methods and their proper placement. If you have any reason to think you are  pregnant, stop using this medicine right away and contact your doctor or health care professional. If you are using this medicine for hormone related problems, it may take several cycles of use to see improvement in your condition. Smoking increases the risk of getting a blood clot or having a stroke while you are using hormonal birth control, especially if you are more than 25 years old. You are strongly advised not to smoke. Some women are prone to getting dark patches on the skin of the face (cholasma). Your risk of getting chloasma with this medicine is higher if you had chloasma during a pregnancy. Keep out of the sun. If you cannot avoid being in the sun, wear protective clothing and use sunscreen. Do not use sun lamps or tanning beds/booths. This medicine can make your body retain fluid, making your fingers, hands, or ankles swell. Your blood pressure can go up. Contact your doctor or health care professional if you feel you are retaining fluid. If you are going to have elective surgery, you may need to stop using this medicine before the surgery. Consult your health care professional for advice. This medicine does not protect you against HIV infection (AIDS) or any other sexually transmitted diseases. What side effects may I notice from receiving this medicine? Side effects that you should report to your doctor or health care professional as soon as  possible:  allergic reactions such as skin rash or itching, hives, swelling of the lips, mouth, tongue, or throat  depression  high blood pressure  migraines or severe, sudden headaches  signs and symptoms of a blood clot such as breathing problems; changes in vision; chest pain; severe, sudden headache; pain, swelling, warmth in the leg; trouble speaking; sudden numbness or weakness of the face, arm or leg  signs and symptoms of infection like fever or chills with dizziness and a sunburn-like rash, or pain or trouble passing urine  stomach pain  symptoms of vaginal infection like itching, irritation or unusual discharge  yellowing of the eyes or skin Side effects that usually do not require medical attention (report these to your doctor or health care professional if they continue or are bothersome):  acne  breast pain, tenderness  irregular vaginal bleeding or spotting, particularly during the first month of use  mild headache  nausea  painful periods  vomiting This list may not describe all possible side effects. Call your doctor for medical advice about side effects. You may report side effects to FDA at 1-800-FDA-1088. Where should I keep my medicine? Keep out of the reach of children. Store unopened rings in the original foil pouch at room temperature between 20 and 25 degrees C (68 and 77 degrees F) for up to 4 months. Protect from light. Do not store above 30 degrees C (86 degrees F). Throw away any unused medicine after the expiration date. A ring may only be used for 1 cycle (1 month). After the 3-week cycle, a used ring is removed and should be placed in the re-closable foil pouch and discarded in the trash out of reach of children and pets. Do NOT flush down the toilet. NOTE: This sheet is a summary. It may not cover all possible information. If you have questions about this medicine, talk to your doctor, pharmacist, or health care provider.  2020 Elsevier/Gold  Standard (2017-02-06 14:41:10)

## 2019-04-12 ENCOUNTER — Other Ambulatory Visit: Payer: Self-pay

## 2019-04-12 MED ORDER — FLUCONAZOLE 150 MG PO TABS: 150.0000 mg | ORAL_TABLET | Freq: Once | ORAL | 0 refills | Status: DC

## 2019-04-12 MED ORDER — FLUCONAZOLE 150 MG PO TABS: 150.0000 mg | ORAL_TABLET | Freq: Once | ORAL | 0 refills | Status: AC

## 2019-04-12 MED ORDER — FLUCONAZOLE 150 MG PO TABS: 150.0000 mg | ORAL_TABLET | Freq: Once | ORAL | 3 refills | Status: DC

## 2019-04-12 NOTE — Progress Notes (Signed)
TC from pt c/o yeast infection. S/p c/s 04/03/2019. Reports outer/inner vaginal irritation and thick vaginal discharge, denies odor. Pt is not breast feeding. Diflucan 1 tab po STAT sent to [facility]. Pt agrees to follow up if sx's worsen or does not subside after txt.

## 2019-04-18 ENCOUNTER — Other Ambulatory Visit: Payer: Self-pay

## 2019-04-18 ENCOUNTER — Ambulatory Visit (INDEPENDENT_AMBULATORY_CARE_PROVIDER_SITE_OTHER): Payer: Medicaid Other | Admitting: Obstetrics & Gynecology

## 2019-04-18 ENCOUNTER — Encounter: Payer: Self-pay | Admitting: Obstetrics & Gynecology

## 2019-04-18 MED ORDER — CITALOPRAM HYDROBROMIDE 20 MG PO TABS: 20.0000 mg | ORAL_TABLET | Freq: Every day | ORAL | 12 refills | Status: DC

## 2019-04-18 NOTE — Patient Instructions (Signed)

## 2019-04-18 NOTE — Progress Notes (Signed)
Subjective:     Leslie Barrett is a 23 y.o. female who presents to the clinic 2 weeks status post repeat cesarean section for elective repeat. Eating a regular diet without difficulty. Bowel movements are normal. The patient is not having any pain.  The following portions of the patient's history were reviewed and updated as appropriate: allergies, current medications, past family history, past medical history, past social history, past surgical history and problem list.  Review of Systems Pertinent items are noted in HPI.    Objective:    BP (!) 143/86   Pulse 74   Wt 218 lb (98.9 kg)   LMP 07/03/2018 (Approximate)   BMI 36.28 kg/m  General:  alert, cooperative and no distress  Abdomen: soft, bowel sounds active, non-tender  Incision:   healing well, no drainage, no erythema, no hernia, no seroma, no swelling, no dehiscence, incision well approximated     Assessment:    Postoperative course complicated by depression Operative findings again reviewed. Pathology report discussed.    Plan:    1. Continue any current medications. 2. Wound care discussed. 3. Activity restrictions: no lifting more than 15 pounds 4. Anticipated return to work: not applicable. 5. Follow up: 4 weeks  6. Referral to [facility], start Celexa 20 mg. Stopped Zoloft during pregnancy and she did not like the side effects of the med although mood was improved  Woodroe Mode, MD   . Patient ID: Leslie Barrett, female   DOB: 08-May-1996, 23 y.o.   MRN: [id number]

## 2019-04-18 NOTE — Progress Notes (Signed)
Incision check with no complaints regarding incision.  Pt wants to discuss pp depression. Pt notes feeling overwhelmed has not took Zoloft stopped taking Rx during pregnancy because she felt it did not help.   EPDS = 16

## 2019-04-22 NOTE — BH Specialist Note (Signed)
Pt did not arrive to video visit, but did answer the phone. Pt said her "other ob is on the phone, I'll call right back", and was informed a link has been sent to her phone for video visit today. A second call to patient (after no call-back over 5 minutes later) said "voicemail is full", so unable to leave a message. Pt did not arrive to video visit; MyChart message sent to patient.   [place] via Telemedicine Video Visit  04/22/2019 Leslie Barrett [id number]  [place]

## 2019-04-25 ENCOUNTER — Other Ambulatory Visit: Payer: Self-pay

## 2019-04-25 ENCOUNTER — Ambulatory Visit: Payer: Medicaid Other | Admitting: Clinical

## 2019-04-25 DIAGNOSIS — Z5329 Procedure and treatment not carried out because of patient's decision for other reasons: Secondary | ICD-10-CM

## 2019-04-25 DIAGNOSIS — Z91199 Patient's noncompliance with other medical treatment and regimen due to unspecified reason: Secondary | ICD-10-CM

## 2019-05-03 ENCOUNTER — Other Ambulatory Visit: Payer: Self-pay

## 2019-05-03 ENCOUNTER — Ambulatory Visit (INDEPENDENT_AMBULATORY_CARE_PROVIDER_SITE_OTHER): Payer: Medicaid Other | Admitting: Clinical

## 2019-05-03 DIAGNOSIS — F4323 Adjustment disorder with mixed anxiety and depressed mood: Secondary | ICD-10-CM | POA: Diagnosis not present

## 2019-05-03 NOTE — BH Specialist Note (Signed)
Integrated Behavioral Health via Telemedicine Video Visit  05/03/2019 Leslie Barrett [id number]  Number of [place] visits: 3 Session Start time: 3:17  Session End time: 3:33 Total time: 20  Referring Provider: Emeterio Reeve, MD Type of Visit: Video Patient/Family location: Home [facility] Provider location: [facility] All persons participating in visit: [name] and [place]  Confirmed patient's address: Yes  Confirmed patient's phone number: Yes  Any changes to demographics: No   Confirmed patient's insurance: Yes  Any changes to patient's insurance: No   Discussed confidentiality: At previous visit  I connected with Leslie Barrett  by a video enabled telemedicine application and verified that I am speaking with the correct person using two identifiers.     I discussed the limitations of evaluation and management by telemedicine and the availability of in person appointments.  I discussed that the purpose of this visit is to provide behavioral health care while limiting exposure to the novel coronavirus.   Discussed there is a possibility of technology failure and discussed alternative modes of communication if that failure occurs.  I discussed that engaging in this video visit, they consent to the provision of behavioral healthcare and the services will be billed under their insurance.  Patient and/or legal guardian expressed understanding and consented to video visit: Yes   PRESENTING CONCERNS: Patient and/or family reports the following symptoms/concerns: Pt states her primary concern today is feeling stressed about current CPS case, feeling really tired, not taking iron pills as recommended by medical providers, and not feeling a big difference on celexa (taking less than 2 weeks). Pt has not found permanent housing, but is staying with baby's godmother, who has been very supportive.  Duration of problem: Increase in perinatal time; Severity  of problem: moderate  STRENGTHS (Protective Factors/Coping Skills): Supportive friends; resiliency  GOALS ADDRESSED: Patient will: 1.  Reduce symptoms of: anxiety, depression and stress  2.  Increase knowledge and/or ability of: healthy habits  3.  Demonstrate ability to: Increase healthy adjustment to current life circumstances, Increase adequate support systems for patient/family and Increase motivation to adhere to plan of care  INTERVENTIONS: Interventions utilized:  Medication Monitoring, Psychoeducation and/or Health Education and Link to [employer] Standardized Assessments completed: Not today, as video continues to go out, and pt needs to get home after baby's appointment  ASSESSMENT: Patient currently experiencing Adjustment disorder with mixed anxiety and depression  Patient may benefit from continued brief therapeutic interventions.   PLAN: 1. Follow up with behavioral health clinician on : One week 2. Behavioral recommendations:  -Continue taking Celexa as prescribed -Start taking iron pills, as recommended by medical providers 3. Referral(s): [place] (In Clinic)  I discussed the assessment and treatment plan with the patient and/or parent/guardian. They were provided an opportunity to ask questions and all were answered. They agreed with the plan and demonstrated an understanding of the instructions.   They were advised to call back or seek an in-person evaluation if the symptoms worsen or if the condition fails to improve as anticipated.  Caroleen Hamman McMannes

## 2019-05-05 ENCOUNTER — Telehealth: Payer: Medicaid Other | Admitting: Obstetrics and Gynecology

## 2019-05-05 ENCOUNTER — Encounter: Payer: Self-pay | Admitting: Obstetrics and Gynecology

## 2019-05-05 NOTE — Progress Notes (Signed)
Unable to reach patient for postpartum visit. Several attempts were made without ability to leave a voicemail

## 2019-05-09 ENCOUNTER — Telehealth: Payer: Self-pay | Admitting: Clinical

## 2019-05-09 NOTE — Telephone Encounter (Signed)
Spoke to patient about her virtual appointment w/ Roselyn Reef on 11/17 @ 8:45. Patient instructed that Roselyn Reef will be giving her a call around her appointment time w/ further instructions on how to do the visit virtually. Patient instructed that she does not have to come to the office for the appointment. Patient verbalized understanding.

## 2019-05-09 NOTE — BH Specialist Note (Addendum)
Integrated Behavioral Health via Telemedicine Video Visit  Patient and/or legal guardian verbally consented to [facility] services about presenting concerns and psychiatric consultation as appropriate.  Update: 05/11/19: Dr. Daleen Bo, our consulting psychiatrist, recommends pt switch to Zoloft from Celexa, as Celexa is not improving symptoms of depression or anxiety.      05/09/2019 Leslie Barrett [id number]  Number of Integrated Behavioral Health visits: 4 Session Start time: 8:49  Session End time: 9:13 Total time: 24 minutes  Referring Provider: Scheryl Darter, MD Type of Visit: Video Patient/Family location: Home [facility] Provider location: [facility] All persons participating in visit: [name] and [facility] Leslie Barrett  Confirmed patient's address: Yes  Confirmed patient's phone number: Yes  Any changes to demographics: No   Confirmed patient's insurance: Yes  Any changes to patient's insurance: No   Discussed confidentiality: At previous visit  I connected with Leslie Barrett by a video enabled telemedicine application and verified that I am speaking with the correct person using two identifiers.     I discussed the limitations of evaluation and management by telemedicine and the availability of in person appointments.  I discussed that the purpose of this visit is to provide behavioral health care while limiting exposure to the novel coronavirus.   Discussed there is a possibility of technology failure and discussed alternative modes of communication if that failure occurs.  I discussed that engaging in this video visit, they consent to the provision of behavioral healthcare and the services will be billed under their insurance.  Patient and/or legal guardian expressed understanding and consented to video visit: Yes   PRESENTING CONCERNS: Patient and/or family reports the following symptoms/concerns: Pt states her daily symptoms are lack of  appetite, lack of quality sleep, anxiety, excessive worry, restlessness, and lack of concentration; pt is taking Celexa, and feeling "no difference", pt not taking iron. Pt lost her grandmother to [phone] in September (after losing uncle to covid19 in June), and is still working with housing agency to find housing.  Duration of problem: Symptom increase in pregnancy and postpartum; Severity of problem: moderately severe  STRENGTHS (Protective Factors/Coping Skills): Supportive friends and resiliency  GOALS ADDRESSED: Patient will: 1.  Reduce symptoms of: anxiety and stress  2.  Increase knowledge and/or ability of: healthy habits  3.  Demonstrate ability to: Increase motivation to adhere to plan of care  INTERVENTIONS: Interventions utilized:  Medication Monitoring and Psychoeducation and/or Health Education Standardized Assessments completed: GAD-7 and PHQ 9  ASSESSMENT: Patient currently experiencing Adjustment disorder with mixed anxiety and depression.   Patient may benefit from continued brief therapeutic interventions regarding coping with symptoms of depression and anxiety.  PLAN: 1. Follow up with behavioral health clinician on : Will discuss at follow up call 2. Behavioral recommendations:  -Continue taking Celexa as prescribed -Pick up iron pills, OTC, and begin taking with orange juice -Continue working with housing agency to find permanent housing -Continue allowing self to grieve family losses Referral(s): Integrated [employer] (In Clinic)  I discussed the assessment and treatment plan with the patient and/or parent/guardian. They were provided an opportunity to ask questions and all were answered. They agreed with the plan and demonstrated an understanding of the instructions.   They were advised to call back or seek an in-person evaluation if the symptoms worsen or if the condition fails to improve as anticipated.  Leslie Barrett Leslie Barrett  Depression screen  [facility] 2/9 05/10/2019 11/25/2018 11/11/2018  Decreased Interest 2 0 3  Down, Depressed,  Hopeless 1 0 1  PHQ - 2 Score 3 0 4  Altered sleeping 3 3 3   Tired, decreased energy 3 0 1  Change in appetite 3 0 3  Feeling bad or failure about yourself  2 0 1  Trouble concentrating 3 0 1  Moving slowly or fidgety/restless 0 0 0  Suicidal thoughts 0 0 0  PHQ-9 Score 17 3 13    GAD 7 : Generalized Anxiety Score 05/10/2019 11/25/2018 11/11/2018  Nervous, Anxious, on Edge 3 0 1  Control/stop worrying 3 0 2  Worry too much - different things 3 0 1  Trouble relaxing 3 0 1  Restless 3 0 1  Easily annoyed or irritable 1 0 3  Afraid - awful might happen 1 0 1  Total GAD 7 Score 17 0 10

## 2019-05-10 ENCOUNTER — Other Ambulatory Visit: Payer: Self-pay

## 2019-05-10 ENCOUNTER — Ambulatory Visit (INDEPENDENT_AMBULATORY_CARE_PROVIDER_SITE_OTHER): Payer: Medicaid Other | Admitting: Clinical

## 2019-05-10 DIAGNOSIS — F4323 Adjustment disorder with mixed anxiety and depressed mood: Secondary | ICD-10-CM

## 2019-05-10 DIAGNOSIS — F4321 Adjustment disorder with depressed mood: Secondary | ICD-10-CM

## 2019-05-23 NOTE — BH Specialist Note (Signed)
Integrated Behavioral Health via Telemedicine Video Visit  05/23/2019 Leslie Barrett [id number]  Number of [place] visits: 1 Session Start time: 1:45  Session End time: 1:54 Total time: 9  Referring Provider: Emeterio Reeve, MD Type of Visit: Video Patient/Family location: Home [place] Provider location: [facility] All persons participating in visit: [name]    Confirmed patient's address: Yes  Confirmed patient's phone number: Yes  Any changes to demographics: No   Confirmed patient's insurance: Yes  Any changes to patient's insurance: No   Discussed confidentiality: At previous visit  I connected with Leslie Barrett by a video enabled telemedicine application and verified that I am speaking with the correct person using two identifiers.     I discussed the limitations of evaluation and management by telemedicine and the availability of in person appointments.  I discussed that the purpose of this visit is to provide behavioral health care while limiting exposure to the novel coronavirus.   Discussed there is a possibility of technology failure and discussed alternative modes of communication if that failure occurs.  I discussed that engaging in this video visit, they consent to the provision of behavioral healthcare and the services will be billed under their insurance.  Patient and/or legal guardian expressed understanding and consented to video visit: Yes   PRESENTING CONCERNS: Patient and/or family reports the following symptoms/concerns: Pt states she stopped taking Celexa as she was feeling no difference in symptoms on Celexa, and requests to go back on Zoloft, as it worked much better to manage symptoms. Pt agrees to follow up in one week, as baby needs attention at the moment and visit cut short.  Duration of problem: Increase perinatal; Severity of problem: moderately severe  STRENGTHS (Protective Factors/Coping  Skills): Resiliency and social support via friends  GOALS ADDRESSED: Patient will: 1.  Reduce symptoms of: anxiety and stress  2.  Demonstrate ability to: Increase healthy adjustment to current life circumstances  INTERVENTIONS: Interventions utilized:  Medication Monitoring Standardized Assessments completed: [place] Postnatal Depression at yesterday's medical visit @ 11, with no SI  ASSESSMENT: Patient currently experiencing Adjustment disorder with mixed anxiety and depression .   Patient may benefit from continued brief therapeutic interventions regarding coping with symptoms of anxiety and depression.  PLAN: 1. Follow up with behavioral health clinician on : One week 2. Behavioral recommendations:  -Begin taking [place] medication as prescribed -Continue working with housing agencies to obtain more permanent housing -Continue to maintain a positive, hopeful outlook in spite of difficult circumstances 3. Referral(s): [place] [place] (In Clinic)  I discussed the assessment and treatment plan with the patient and/or parent/guardian. They were provided an opportunity to ask questions and all were answered. They agreed with the plan and demonstrated an understanding of the instructions.   They were advised to call back or seek an in-person evaluation if the symptoms worsen or if the condition fails to improve as anticipated.  Caroleen Hamman McMannes

## 2019-05-24 ENCOUNTER — Ambulatory Visit (INDEPENDENT_AMBULATORY_CARE_PROVIDER_SITE_OTHER): Payer: Medicaid Other | Admitting: Obstetrics & Gynecology

## 2019-05-24 ENCOUNTER — Encounter: Payer: Self-pay | Admitting: Obstetrics & Gynecology

## 2019-05-24 ENCOUNTER — Other Ambulatory Visit: Payer: Self-pay

## 2019-05-24 DIAGNOSIS — Z98891 History of uterine scar from previous surgery: Secondary | ICD-10-CM

## 2019-05-24 DIAGNOSIS — R87610 Atypical squamous cells of undetermined significance on cytologic smear of cervix (ASC-US): Secondary | ICD-10-CM

## 2019-05-24 DIAGNOSIS — R87810 Cervical high risk human papillomavirus (HPV) DNA test positive: Secondary | ICD-10-CM

## 2019-05-24 DIAGNOSIS — Z3049 Encounter for surveillance of other contraceptives: Secondary | ICD-10-CM

## 2019-05-24 MED ORDER — ETONOGESTREL-ETHINYL ESTRADIOL 0.12-0.015 MG/24HR VA RING: 1.0000 | VAGINAL_RING | VAGINAL | 11 refills | Status: DC

## 2019-05-24 NOTE — Progress Notes (Signed)
Pt is currently has NuvaRing for [facility].  PP score= 11  Pt has appt with behavioral health tomorrow.   Post Partum Exam  Leslie Barrett is a 23 y.o. [phone] female who presents for a postpartum visit. She is 7 weeks postpartum following a low cervical transverse Cesarean section. I have fully reviewed the prenatal and intrapartum course. The delivery was at 39.1 gestational weeks.  Anesthesia: spinal. Postpartum course has been complicated by pp depression. Baby's course has been good. Baby is feeding by bottle - Marcos Eke. Bleeding no bleeding. Bowel function is normal. Bladder function is normal. Patient is not sexually active. Contraception method is NuvaRing vaginal inserts. Postpartum depression screening:neg  The following portions of the patient's history were reviewed and updated as appropriate: allergies, current medications, past family history, past medical history, past social history, past surgical history and problem list. Last pap smear done 10/2018 and was Abnormal- ASCUS pos HR HPV  Review of Systems Gastrointestinal: positive for incision pain    Objective:  Last menstrual period 07/03/2018, unknown if currently breastfeeding.  General:  alert, cooperative and no distress           Abdomen: incsion healing well not tender   Vulva:  not evaluated  Vagina: not evaluated                    Assessment:    normal postpartum exam. Pap smear not done at today's visit.   Plan:   1. Contraception: NuvaRing vaginal inserts 2. Needs f/u colposcopy 3. Follow up in: 2 weeks or as needed.   Woodroe Mode, MD 05/24/2019

## 2019-05-24 NOTE — Patient Instructions (Signed)
Ethinyl Estradiol; Etonogestrel vaginal ring What is this medicine? ETHINYL ESTRADIOL; ETONOGESTREL (ETH in il es tra DYE ole; et oh noe JES trel) vaginal ring is a flexible, vaginal ring used as a contraceptive (birth control method). This medicine combines 2 types of female hormones, an estrogen and a progestin. This ring is used to prevent ovulation and pregnancy. Each ring is effective for 1 month. This medicine may be used for other purposes; ask your health care provider or pharmacist if you have questions. COMMON BRAND NAME(S): EluRyng, NuvaRing What should I tell my health care provider before I take this medicine? They need to know if you have any of these conditions:  abnormal vaginal bleeding  blood vessel disease or blood clots  breast, cervical, endometrial, ovarian, liver, or uterine cancer  diabetes  gallbladder disease  having surgery  heart disease or recent heart attack  high blood pressure  high cholesterol or triglycerides  history of irregular heartbeat or heart valve problems  kidney disease  liver disease  migraine headaches  protein C deficiency  protein S deficiency  recently had a baby, miscarriage, or abortion  stroke  systemic lupus erythematosus (SLE)  tobacco smoker  your age is more than 25 years old  an unusual or allergic reaction to estrogens, progestins, other medicines, foods, dyes, or preservatives  pregnant or trying to get pregnant  breast-feeding How should I use this medicine? Insert the ring into your vagina as directed. Follow the directions on the prescription label. The ring will remain place for 3 weeks and is then removed for a 1-week break. A new ring is inserted 1 week after the last ring was removed, on the same day of the week. Check often to make sure the ring is still in place. If the ring was out of the vagina for an unknown amount of time, you may not be protected from pregnancy. Perform a pregnancy test and  call your doctor. Do not use more often than directed. A patient package insert for the product will be given with each prescription and refill. Read this sheet carefully each time. The sheet may change frequently. Contact your pediatrician regarding the use of this medicine in children. Special care may be needed. Overdosage: If you think you have taken too much of this medicine contact a poison control center or emergency room at once. NOTE: This medicine is only for you. Do not share this medicine with others. What if I miss a dose? You will need to use the ring exactly as directed. It is very important to follow the schedule every cycle. If you do not use the ring as directed, you may not be protected from pregnancy. If the ring should slip out, is lost, or if you leave it in longer or shorter than you should, contact your health care professional for advice. What may interact with this medicine? Do not take this medicine with the following medications:  dasabuvir; ombitasvir; paritaprevir; ritonavir  ombitasvir; paritaprevir; ritonavir  vaginal lubricants or other vaginal products that are oil-based or silicone-based This medicine may also interact with the following medications:  acetaminophen  antibiotics or medicines for infections, especially rifampin, rifabutin, rifapentine, and griseofulvin, and possibly penicillins or tetracyclines  aprepitant or fosaprepitant  armodafinil  ascorbic acid (vitamin C)  barbiturate medicines, such as phenobarbital or primidone  bosentan  certain antiviral medicines for hepatitis, HIV or AIDS  certain medicines for cancer treatment  certain medicines for seizures like carbamazepine, clobazam, felbamate, lamotrigine, oxcarbazepine, phenytoin,   rufinamide, topiramate  certain medicines for treating high cholesterol  cyclosporine  dantrolene  elagolix  flibanserin  grapefruit juice  lesinurad  medicines for diabetes  medicines  to treat fungal infections, such as griseofulvin, miconazole, fluconazole, ketoconazole, itraconazole, posaconazole or voriconazole  mifepristone  mitotane  modafinil  morphine  mycophenolate  St. John's wort  tamoxifen  temazepam  theophylline or aminophylline  thyroid hormones  tizanidine  tranexamic acid  ulipristal  warfarin This list may not describe all possible interactions. Give your health care provider a list of all the medicines, herbs, non-prescription drugs, or dietary supplements you use. Also tell them if you smoke, drink alcohol, or use illegal drugs. Some items may interact with your medicine. What should I watch for while using this medicine? Visit your doctor or health care professional for regular checks on your progress. You will need a regular breast and pelvic exam and Pap smear while on this medicine. Check with your doctor or health care professional to see if you need an additional method of contraception during the first cycle that you use this ring. Female condoms (made with natural rubber latex, polyisoprene, and polyurethane) and spermicides may be used. Do not use a diaphragm, cervical cap, or a female condom, as the ring can interfere with these birth control methods and their proper placement. If you have any reason to think you are pregnant, stop using this medicine right away and contact your doctor or health care professional. If you are using this medicine for hormone related problems, it may take several cycles of use to see improvement in your condition. Smoking increases the risk of getting a blood clot or having a stroke while you are using hormonal birth control, especially if you are more than 25 years old. You are strongly advised not to smoke. Some women are prone to getting dark patches on the skin of the face (cholasma). Your risk of getting chloasma with this medicine is higher if you had chloasma during a pregnancy. Keep out of the  sun. If you cannot avoid being in the sun, wear protective clothing and use sunscreen. Do not use sun lamps or tanning beds/booths. This medicine can make your body retain fluid, making your fingers, hands, or ankles swell. Your blood pressure can go up. Contact your doctor or health care professional if you feel you are retaining fluid. If you are going to have elective surgery, you may need to stop using this medicine before the surgery. Consult your health care professional for advice. This medicine does not protect you against HIV infection (AIDS) or any other sexually transmitted diseases. What side effects may I notice from receiving this medicine? Side effects that you should report to your doctor or health care professional as soon as possible:  allergic reactions such as skin rash or itching, hives, swelling of the lips, mouth, tongue, or throat  depression  high blood pressure  migraines or severe, sudden headaches  signs and symptoms of a blood clot such as breathing problems; changes in vision; chest pain; severe, sudden headache; pain, swelling, warmth in the leg; trouble speaking; sudden numbness or weakness of the face, arm or leg  signs and symptoms of infection like fever or chills with dizziness and a sunburn-like rash, or pain or trouble passing urine  stomach pain  symptoms of vaginal infection like itching, irritation or unusual discharge  yellowing of the eyes or skin Side effects that usually do not require medical attention (report these to your doctor   or health care professional if they continue or are bothersome):  acne  breast pain, tenderness  irregular vaginal bleeding or spotting, particularly during the first month of use  mild headache  nausea  painful periods  vomiting This list may not describe all possible side effects. Call your doctor for medical advice about side effects. You may report side effects to FDA at 1-800-FDA-1088. Where should I  keep my medicine? Keep out of the reach of children. Store unopened rings in the original foil pouch at room temperature between 20 and 25 degrees C (68 and 77 degrees F) for up to 4 months. Protect from light. Do not store above 30 degrees C (86 degrees F). Throw away any unused medicine after the expiration date. A ring may only be used for 1 cycle (1 month). After the 3-week cycle, a used ring is removed and should be placed in the re-closable foil pouch and discarded in the trash out of reach of children and pets. Do NOT flush down the toilet. NOTE: This sheet is a summary. It may not cover all possible information. If you have questions about this medicine, talk to your doctor, pharmacist, or health care provider.  2020 Elsevier/Gold Standard (2017-02-06 14:41:10)  

## 2019-05-25 ENCOUNTER — Other Ambulatory Visit: Payer: Self-pay | Admitting: Obstetrics & Gynecology

## 2019-05-25 ENCOUNTER — Ambulatory Visit: Payer: Medicaid Other | Admitting: Clinical

## 2019-05-25 DIAGNOSIS — Z658 Other specified problems related to psychosocial circumstances: Secondary | ICD-10-CM

## 2019-05-25 DIAGNOSIS — F4323 Adjustment disorder with mixed anxiety and depressed mood: Secondary | ICD-10-CM

## 2019-05-25 MED ORDER — SERTRALINE HCL 50 MG PO TABS: 50.0000 mg | ORAL_TABLET | Freq: Every day | ORAL | 5 refills | Status: AC

## 2019-05-25 NOTE — Progress Notes (Signed)
Zoloft Rx refilled

## 2019-06-02 ENCOUNTER — Ambulatory Visit (INDEPENDENT_AMBULATORY_CARE_PROVIDER_SITE_OTHER): Payer: Medicaid Other | Admitting: Certified Nurse Midwife

## 2019-06-02 ENCOUNTER — Other Ambulatory Visit: Payer: Self-pay

## 2019-06-02 ENCOUNTER — Encounter: Payer: Self-pay | Admitting: Certified Nurse Midwife

## 2019-06-02 DIAGNOSIS — Z3009 Encounter for other general counseling and advice on contraception: Secondary | ICD-10-CM

## 2019-06-02 NOTE — Progress Notes (Signed)
Virtual Visit via Telephone Note  I connected with Leslie Barrett on 06/02/19 at  3:30 PM EST by telephone and verified that I am speaking with the correct person using two identifiers.  Pt requests to discuss contraception options.  She is leaning towards BCP. Denies IC since delivery

## 2019-06-02 NOTE — Progress Notes (Signed)
TELEHEALTH GYNECOLOGY VIRTUAL VIDEO VISIT ENCOUNTER NOTE  Provider location: [facility] [place]   I connected with [name] on 06/02/19 at  3:45 PM EST by MyChart Video Encounter at home and verified that I am speaking with the correct person using two identifiers.   I discussed the limitations, risks, security and privacy concerns of performing an evaluation and management service virtually and the availability of in person appointments. I also discussed with the patient that there may be a patient responsible charge related to this service. The patient expressed understanding and agreed to proceed.   History:  Leslie Barrett is a 23 y.o. [phone] female being evaluated today for birth control counseling. She denies any abnormal vaginal discharge, bleeding, pelvic pain or other concerns.       Past Medical History:  Diagnosis Date  . Anxiety   . H/O chlamydia infection   . Medical history non-contributory    Past Surgical History:  Procedure Laterality Date  . CESAREAN SECTION    . CESAREAN SECTION N/A 04/03/2019   Procedure: REPEAT CESAREAN SECTION;  Surgeon: Osborne Oman, MD;  Location: [facility] LD ORS;  Service: Obstetrics;  Laterality: N/A;   The following portions of the patient's history were reviewed and updated as appropriate: allergies, current medications, past family history, past medical history, past social history, past surgical history and problem list.   Health Maintenance:  abnormal pap and positive HPV on 10/28/18- needs colposcopy, needs to be scheduled    Review of Systems:  Pertinent items noted in HPI and remainder of comprehensive ROS otherwise negative.  Physical Exam:   General:  Alert, oriented and cooperative. Patient appears to be in no acute distress.  Mental Status: Normal mood and affect. Normal behavior. Normal judgment and thought content.   Respiratory: Normal respiratory effort, no problems with respiration noted    Rest of physical exam deferred due to type of encounter  Labs and Imaging No results found for this or any previous visit (from the past 336 hour(s)). No results found.     Assessment and Plan:     1. Birth control counseling - Patient reports she is currently using NuvaRing for contraception, which she is not likely due to nuvaring moving when she is walking or exercising and not staying in correct position  - Patient wants contraception that she does not have to think about or continue to adjust- patient has been on Depo, Nexplanon and OCPs in past and does not want to go back to those.  - Educated and discussed birth control options in detail including IUD as a LARC method, side effects and benefits of method - answered patient's questions on the IUD and MyChart message sent to patient with more information from brochure  - Patient denies IC since prior to delivery and decided on IUD for birth control  - Patient request first available appointment  - Message sent to office to schedule that appointment  - Discussed with patient the need for colposcopy to be scheduled as well but not on same day - patient verbalizes understanding      I discussed the assessment and treatment plan with the patient. The patient was provided an opportunity to ask questions and all were answered. The patient agreed with the plan and demonstrated an understanding of the instructions.   The patient was advised to call back or seek an in-person evaluation/go to the ED if the symptoms worsen or if the condition fails to  improve as anticipated.  I provided 8 minutes of face-to-face time during this encounter.   Sharyon Cable, CNM [facility] for [employer], [facility]

## 2019-06-02 NOTE — Patient Instructions (Signed)
Levonorgestrel intrauterine device (IUD) What is this medicine? LEVONORGESTREL IUD (LEE voe nor jes trel) is a contraceptive (birth control) device. The device is placed inside the uterus by a healthcare professional. It is used to prevent pregnancy. This device can also be used to treat heavy bleeding that occurs during your period. This medicine may be used for other purposes; ask your health care provider or pharmacist if you have questions. COMMON BRAND NAME(S): Kyleena, LILETTA, Mirena, Skyla What should I tell my health care provider before I take this medicine? They need to know if you have any of these conditions:  abnormal Pap smear  cancer of the breast, uterus, or cervix  diabetes  endometritis  genital or pelvic infection now or in the past  have more than one sexual partner or your partner has more than one partner  heart disease  history of an ectopic or tubal pregnancy  immune system problems  IUD in place  liver disease or tumor  problems with blood clots or take blood-thinners  seizures  use intravenous drugs  uterus of unusual shape  vaginal bleeding that has not been explained  an unusual or allergic reaction to levonorgestrel, other hormones, silicone, or polyethylene, medicines, foods, dyes, or preservatives  pregnant or trying to get pregnant  breast-feeding How should I use this medicine? This device is placed inside the uterus by a health care professional. Talk to your pediatrician regarding the use of this medicine in children. Special care may be needed. Overdosage: If you think you have taken too much of this medicine contact a poison control center or emergency room at once. NOTE: This medicine is only for you. Do not share this medicine with others. What if I miss a dose? This does not apply. Depending on the brand of device you have inserted, the device will need to be replaced every 3 to 6 years if you wish to continue using this type  of birth control. What may interact with this medicine? Do not take this medicine with any of the following medications:  amprenavir  bosentan  fosamprenavir This medicine may also interact with the following medications:  aprepitant  armodafinil  barbiturate medicines for inducing sleep or treating seizures  bexarotene  boceprevir  griseofulvin  medicines to treat seizures like carbamazepine, ethotoin, felbamate, oxcarbazepine, phenytoin, topiramate  modafinil  pioglitazone  rifabutin  rifampin  rifapentine  some medicines to treat HIV infection like atazanavir, efavirenz, indinavir, lopinavir, nelfinavir, tipranavir, ritonavir  St. John's wort  warfarin This list may not describe all possible interactions. Give your health care provider a list of all the medicines, herbs, non-prescription drugs, or dietary supplements you use. Also tell them if you smoke, drink alcohol, or use illegal drugs. Some items may interact with your medicine. What should I watch for while using this medicine? Visit your doctor or health care professional for regular check ups. See your doctor if you or your partner has sexual contact with others, becomes HIV positive, or gets a sexual transmitted disease. This product does not protect you against HIV infection (AIDS) or other sexually transmitted diseases. You can check the placement of the IUD yourself by reaching up to the top of your vagina with clean fingers to feel the threads. Do not pull on the threads. It is a good habit to check placement after each menstrual period. Call your doctor right away if you feel more of the IUD than just the threads or if you cannot feel the threads at   all. The IUD may come out by itself. You may become pregnant if the device comes out. If you notice that the IUD has come out use a backup birth control method like condoms and call your health care provider. Using tampons will not change the position of the  IUD and are okay to use during your period. This IUD can be safely scanned with magnetic resonance imaging (MRI) only under specific conditions. Before you have an MRI, tell your healthcare provider that you have an IUD in place, and which type of IUD you have in place. What side effects may I notice from receiving this medicine? Side effects that you should report to your doctor or health care professional as soon as possible:  allergic reactions like skin rash, itching or hives, swelling of the face, lips, or tongue  fever, flu-like symptoms  genital sores  high blood pressure  no menstrual period for 6 weeks during use  pain, swelling, warmth in the leg  pelvic pain or tenderness  severe or sudden headache  signs of pregnancy  stomach cramping  sudden shortness of breath  trouble with balance, talking, or walking  unusual vaginal bleeding, discharge  yellowing of the eyes or skin Side effects that usually do not require medical attention (report to your doctor or health care professional if they continue or are bothersome):  acne  breast pain  change in sex drive or performance  changes in weight  cramping, dizziness, or faintness while the device is being inserted  headache  irregular menstrual bleeding within first 3 to 6 months of use  nausea This list may not describe all possible side effects. Call your doctor for medical advice about side effects. You may report side effects to FDA at 1-800-FDA-1088. Where should I keep my medicine? This does not apply. NOTE: This sheet is a summary. It may not cover all possible information. If you have questions about this medicine, talk to your doctor, pharmacist, or health care provider.  2020 Elsevier/Gold Standard (2018-04-20 13:22:01)  

## 2019-06-03 NOTE — BH Specialist Note (Signed)
Pt did not arrive to video visit and did not answer the phone ; Voicemail full; left MyChart message for patient.    [name] via Telemedicine Video Visit  06/03/2019 Leslie Barrett [phone]  Garlan Fair

## 2019-06-06 ENCOUNTER — Ambulatory Visit: Payer: Medicaid Other | Admitting: Clinical

## 2019-06-06 DIAGNOSIS — Z5329 Procedure and treatment not carried out because of patient's decision for other reasons: Secondary | ICD-10-CM

## 2019-06-06 DIAGNOSIS — Z91199 Patient's noncompliance with other medical treatment and regimen due to unspecified reason: Secondary | ICD-10-CM

## 2019-06-14 ENCOUNTER — Ambulatory Visit (INDEPENDENT_AMBULATORY_CARE_PROVIDER_SITE_OTHER): Payer: Medicaid Other | Admitting: Advanced Practice Midwife

## 2019-06-14 ENCOUNTER — Other Ambulatory Visit: Payer: Self-pay

## 2019-06-14 ENCOUNTER — Encounter: Payer: Self-pay | Admitting: Advanced Practice Midwife

## 2019-06-14 VITALS — BP 120/73 | HR 85 | Wt 208.0 lb

## 2019-06-14 DIAGNOSIS — Z3202 Encounter for pregnancy test, result negative: Secondary | ICD-10-CM | POA: Diagnosis not present

## 2019-06-14 DIAGNOSIS — Z3009 Encounter for other general counseling and advice on contraception: Secondary | ICD-10-CM | POA: Diagnosis not present

## 2019-06-14 DIAGNOSIS — Z30430 Encounter for insertion of intrauterine contraceptive device: Secondary | ICD-10-CM | POA: Diagnosis not present

## 2019-06-14 LAB — POCT URINE PREGNANCY: Preg Test, Ur: NEGATIVE

## 2019-06-14 NOTE — Progress Notes (Addendum)
  GYNECOLOGY PROGRESS NOTE  History:  23 y.o. [id number] presents to [facility] office today for contreptive visit. She is interested in IUD but wants to discuss all contraceptive options.  She is 8 weeks PP following RLTCS and is doing well.  She had ASCUS with HPV on her initial OB Pap and needs colposcopy scheduled.   She denies h/a, dizziness, shortness of breath, n/v, or fever/chills.    The following portions of the patient's history were reviewed and updated as appropriate: allergies, current medications, past family history, past medical history, past social history, past surgical history and problem list. Last pap smear on 10/28/2018 was abnormal with ASCUS, Positive HRHPV.  Review of Systems:  Pertinent items are noted in HPI.   Objective:  Physical Exam Blood pressure 120/73, pulse 85, weight 208 lb (94.3 kg), last menstrual period 06/04/2019, not currently breastfeeding. VS reviewed, nursing note reviewed,  Constitutional: well developed, well nourished, no distress HEENT: normocephalic CV: normal rate Pulm/chest wall: normal effort Breast Exam: deferred Abdomen: soft Neuro: alert and oriented x 3 Skin: warm, dry Psych: affect normal Pelvic exam: Cervix pink, visually closed, without lesion, scant white creamy discharge, vaginal walls and external genitalia normal  IUD Procedure Note Patient identified, informed consent performed.  Discussed risks of irregular bleeding, cramping, infection, malpositioning or misplacement of the IUD outside the uterus which may require further procedures. Time out was performed.  Urine pregnancy test negative.  Speculum placed in the vagina.  Cervix visualized.  Cleaned with Betadine x 2.  Grasped anteriorly with a single tooth tenaculum.  Uterus sounded to 8 cm.  Mirena IUD placed per manufacturer's recommendations.  Strings trimmed to 3-4 cm. Tenaculum was removed, good hemostasis noted.  Patient tolerated procedure well.   Patient was given  post-procedure instructions and the Mirena care card with expiration date.  Patient was also asked to check IUD strings periodically and follow up in 4-6 weeks for IUD check.  Assessment & Plan:   1. Encounter for IUD insertion - POCT urine pregnancy.   UPT negative. --IUD inserted without difficulty. Pt tolerated well. See procedure note above.  2. Encounter for counseling regarding contraception --Pt undecided when she arrived for appointment today. Wanted to review all her options again. Discussed LARCs as most effective forms of birth control.  Discussed benefits/risks of other methods.  Pt desires Mirena IUD today.    Fatima Blank, CNM 8:58 AM

## 2019-06-14 NOTE — Patient Instructions (Signed)

## 2019-06-21 MED ORDER — LEVONORGESTREL 20 MCG/24HR IU IUD: INTRAUTERINE_SYSTEM | Freq: Once | INTRAUTERINE | Status: AC

## 2019-06-21 NOTE — Addendum Note (Signed)
Addended by: Delrae Alfred on: 06/21/2019 04:46 PM   Modules accepted: Orders

## 2019-07-26 ENCOUNTER — Encounter: Payer: Self-pay | Admitting: Obstetrics and Gynecology

## 2019-07-26 ENCOUNTER — Ambulatory Visit (INDEPENDENT_AMBULATORY_CARE_PROVIDER_SITE_OTHER): Payer: Medicaid Other | Admitting: Obstetrics and Gynecology

## 2019-07-26 ENCOUNTER — Other Ambulatory Visit (HOSPITAL_COMMUNITY)
Admission: RE | Admit: 2019-07-26 | Discharge: 2019-07-26 | Disposition: A | Discharge status: Home / Self Care | Payer: Medicaid Other | Source: Ambulatory Visit | Attending: Obstetrics and Gynecology | Admitting: Obstetrics and Gynecology

## 2019-07-26 ENCOUNTER — Other Ambulatory Visit: Payer: Self-pay

## 2019-07-26 VITALS — BP 112/80 | HR 66 | Wt 204.0 lb

## 2019-07-26 DIAGNOSIS — R87810 Cervical high risk human papillomavirus (HPV) DNA test positive: Secondary | ICD-10-CM | POA: Diagnosis present

## 2019-07-26 DIAGNOSIS — Z3202 Encounter for pregnancy test, result negative: Secondary | ICD-10-CM

## 2019-07-26 DIAGNOSIS — Z01812 Encounter for preprocedural laboratory examination: Secondary | ICD-10-CM

## 2019-07-26 DIAGNOSIS — Z30431 Encounter for routine checking of intrauterine contraceptive device: Secondary | ICD-10-CM

## 2019-07-26 DIAGNOSIS — R87610 Atypical squamous cells of undetermined significance on cytologic smear of cervix (ASC-US): Secondary | ICD-10-CM

## 2019-07-26 DIAGNOSIS — N870 Mild cervical dysplasia: Secondary | ICD-10-CM

## 2019-07-26 LAB — POCT URINE PREGNANCY: Preg Test, Ur: NEGATIVE

## 2019-07-26 NOTE — Addendum Note (Signed)
Addended by: Marya Landry D on: 07/26/2019 11:55 AM   Modules accepted: Orders

## 2019-07-26 NOTE — Patient Instructions (Signed)
Colposcopy, Care After This sheet gives you information about how to care for yourself after your procedure. Your doctor may also give you more specific instructions. If you have problems or questions, contact your doctor. What can I expect after the procedure? If you did not have a tissue sample removed (did not have a biopsy), you may only have some spotting for a few days. You can go back to your normal activities. If you had a tissue sample removed, it is common to have:  Soreness and pain. This may last for a few days.  Light-headedness.  Mild bleeding from your vagina or dark-colored, grainy discharge from your vagina. This may last for a few days. You may need to wear a sanitary pad.  Spotting for at least 48 hours after the procedure. Follow these instructions at home:   Take over-the-counter and prescription medicines only as told by your doctor. Ask your doctor what medicines you can start taking again. This is very important if you take blood-thinning medicine.  Do not drive or use heavy machinery while taking prescription pain medicine.  For 3 days, or as long as your doctor tells you, avoid: ? Douching. ? Using tampons. ? Having sex.  If you use birth control (contraception), keep using it.  Limit activity for the first day after the procedure. Ask your doctor what activities are safe for you.  It is up to you to get the results of your procedure. Ask your doctor when your results will be ready.  Keep all follow-up visits as told by your doctor. This is important. Contact a doctor if:  You get a skin rash. Get help right away if:  You are bleeding a lot from your vagina. It is a lot of bleeding if you are using more than one pad an hour for 2 hours in a row.  You have clumps of blood (blood clots) coming from your vagina.  You have a fever.  You have chills  You have pain in your lower belly (pelvic area).  You have signs of infection, such as vaginal  discharge that is: ? Different than usual. ? Yellow. ? Bad-smelling.  You have very pain or cramps in your lower belly that do not get better with medicine.  You feel light-headed.  You feel dizzy.  You pass out (faint). Summary  If you did not have a tissue sample removed (did not have a biopsy), you may only have some spotting for a few days. You can go back to your normal activities.  If you had a tissue sample removed, it is common to have mild pain and spotting for 48 hours.  For 3 days, or as long as your doctor tells you, avoid douching, using tampons and having sex.  Get help right away if you have bleeding, very bad pain, or signs of infection. This information is not intended to replace advice given to you by your health care provider. Make sure you discuss any questions you have with your health care provider. Document Revised: 05/22/2017 Document Reviewed: 02/27/2016 Elsevier Patient Education  2020 Elsevier Inc.  

## 2019-07-26 NOTE — Progress Notes (Signed)
    GYNECOLOGY CLINIC COLPOSCOPY PROCEDURE NOTE  24 y.o. [id number] here for colposcopy for ASCUS with POSITIVE high risk HPV pap smear on 5/20. Discussed role for HPV in cervical dysplasia, need for surveillance.  Patient given informed consent, signed copy in the chart, time out was performed.  Placed in lithotomy position. Cervix viewed with speculum and colposcope after application of acetic acid.   Colposcopy adequate? Yes  no visible lesions; corresponding biopsies obtained at 6 & 12 o'clock.  ECC specimen obtained. All specimens were labelled and sent to pathology. Monsel's applied. IUD strings noted @ 3 cm  Pt tolerated well   Patient was given post procedure instructions.  Will follow up pathology and manage accordingly.  Routine preventative health maintenance measures emphasized.    Nettie Elm, MD, FACOG Attending Obstetrician & Gynecologist Center for [facility], [facility]

## 2019-07-27 LAB — SURGICAL PATHOLOGY

## 2019-08-26 ENCOUNTER — Telehealth: Payer: Self-pay | Admitting: Clinical

## 2019-08-26 NOTE — Telephone Encounter (Signed)
Called Pt to schedule appt. Voicemail box full.

## 2019-09-29 ENCOUNTER — Other Ambulatory Visit: Payer: Self-pay | Admitting: Urology

## 2019-09-29 DIAGNOSIS — N390 Urinary tract infection, site not specified: Secondary | ICD-10-CM

## 2019-10-14 ENCOUNTER — Other Ambulatory Visit: Payer: Medicaid Other

## 2020-01-11 IMAGING — US US MFM OB DETAIL +14 WK
1 series · 13 of 28 positions shown · non-contrast
Comparison: none

[Series 1: us mfm ob detail +14 wk · 13 of 103 slices shown]
[im 4/103]
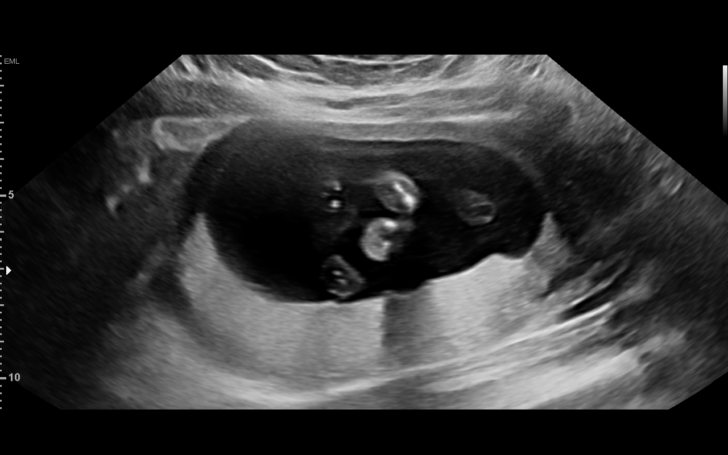
[im 12/103]
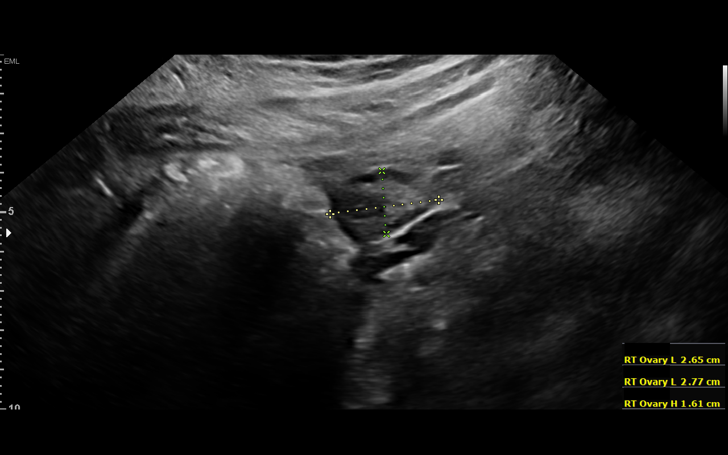
[im 19/103]
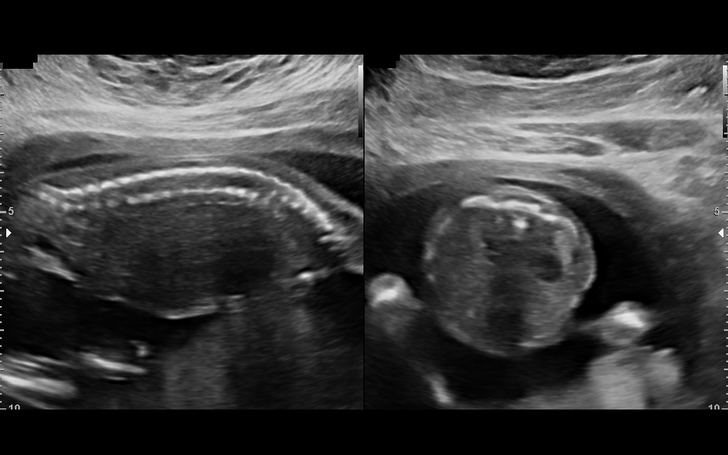
[im 27/103]
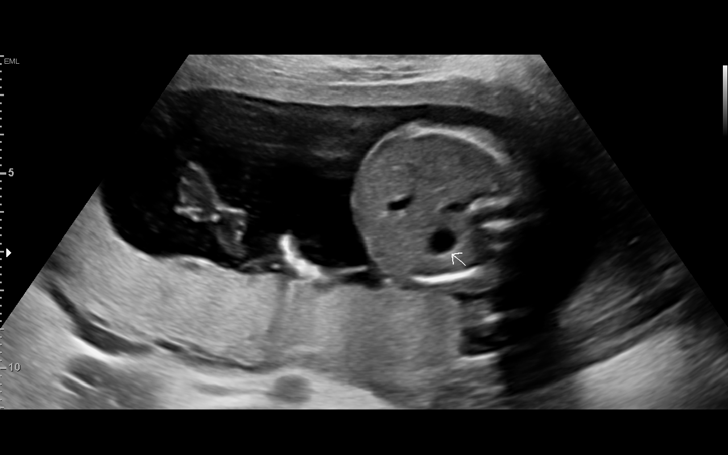
[im 35/103]
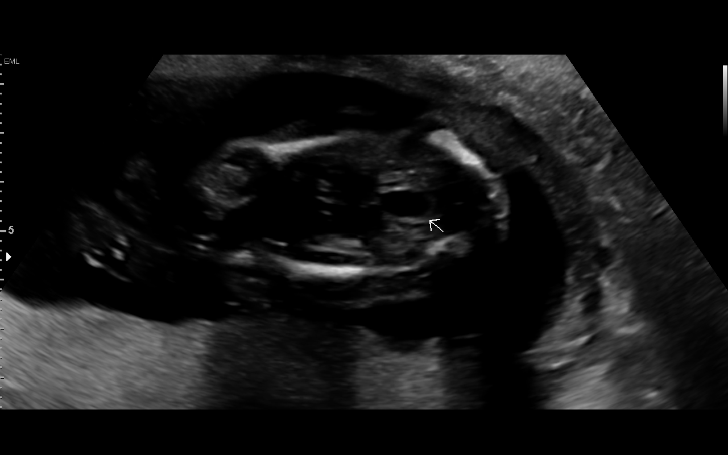
[im 42/103]
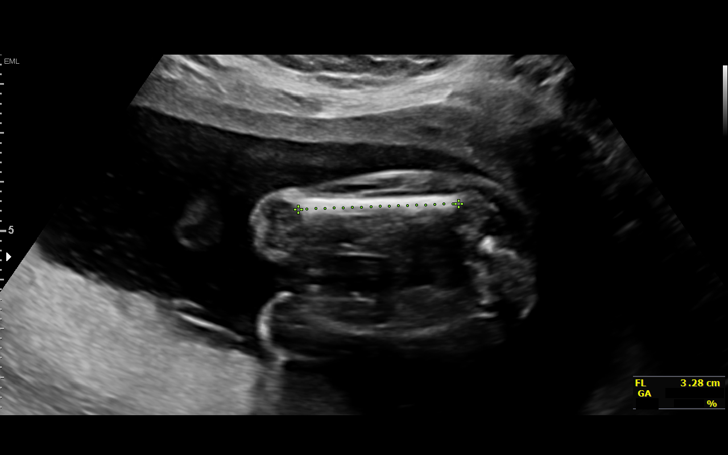
[im 53/103]
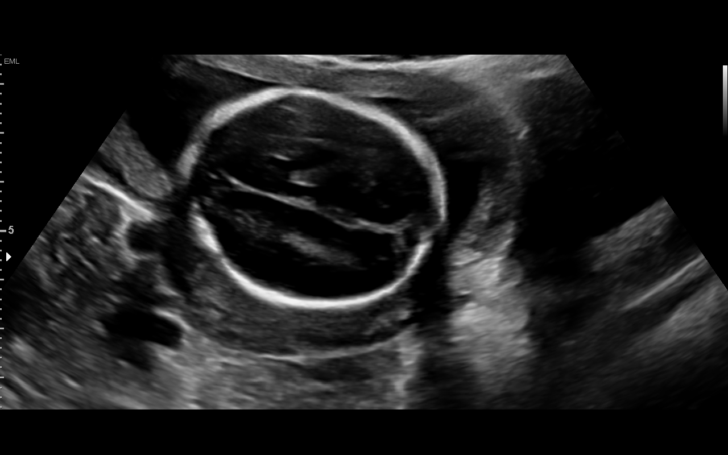
[im 61/103]
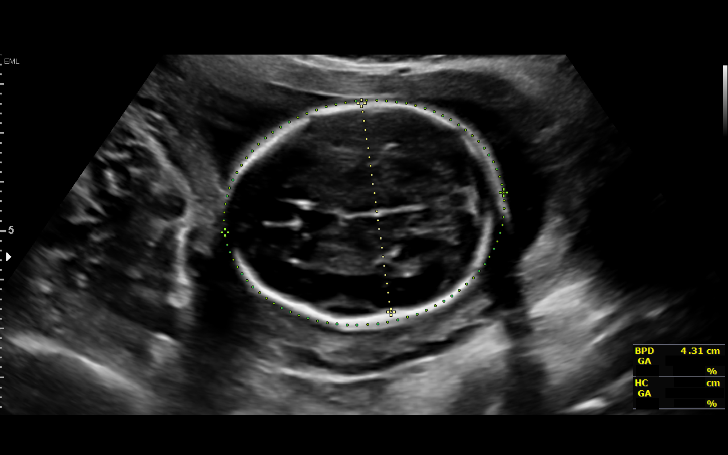
[im 69/103]
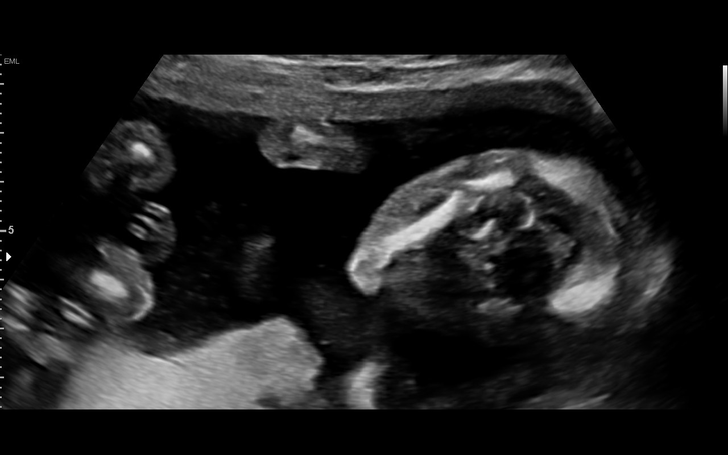
[im 76/103]
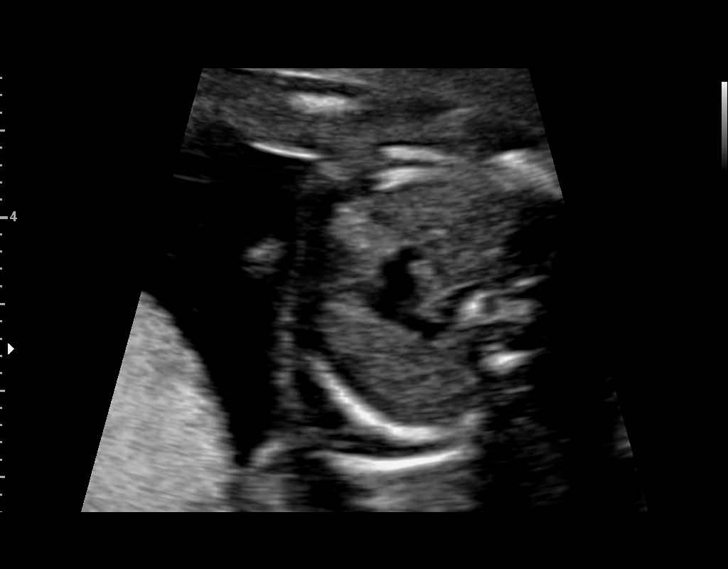
[im 84/103]
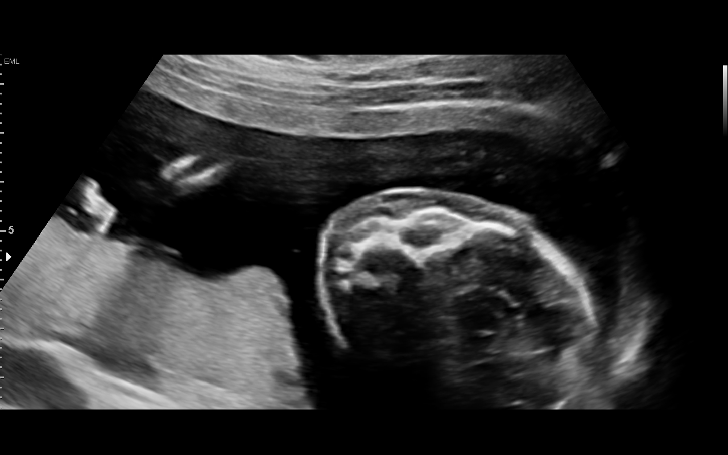
[im 91/103]
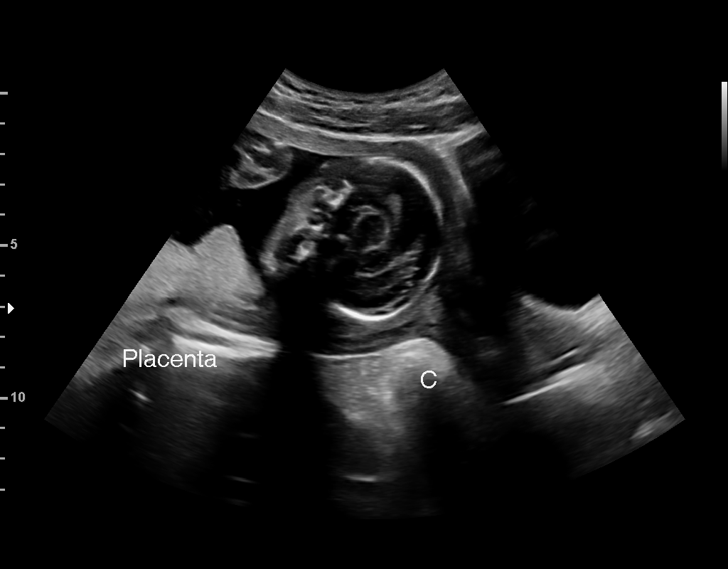
[im 99/103]
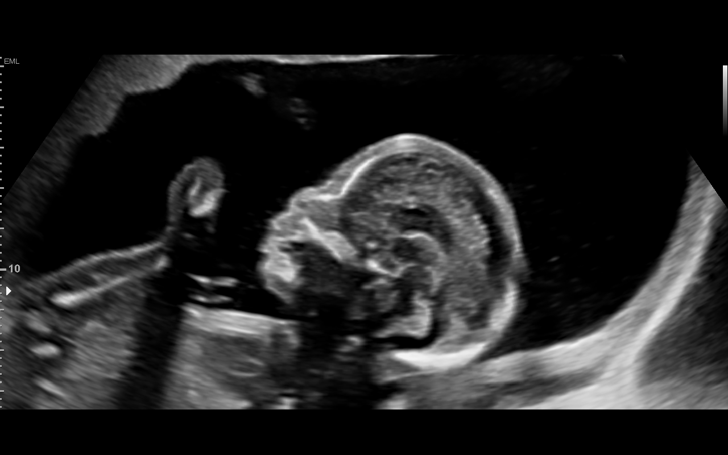

[13 of 28 positions shown; findings below may reference images not displayed]

----------------------------------------------------------------------

 ----------------------------------------------------------------------
Indications

  19 weeks gestation of pregnancy
  Previous cesarean delivery, antepartum
  Encounter for antenatal screening for
  malformations (low risk NIPT, female)
  History of sickle cell trait
 ----------------------------------------------------------------------
Vital Signs

 BMI:
Fetal Evaluation

 Num Of Fetuses:         1
 Fetal Heart Rate(bpm):  155
 Cardiac Activity:       Observed
 Presentation:           Cephalic
 Placenta:               Posterior
 P. Cord Insertion:      Visualized, central

 Amniotic Fluid
 AFI FV:      Within normal limits

                             Largest Pocket(cm)

Biometry

 BPD:      43.4  mm     G. Age:  19w 1d         26  %    CI:        72.67   %    70 - 86
                                                         FL/HC:      19.9   %    16.8 -
 HC:      161.9  mm     G. Age:  19w 0d         14  %    HC/AC:      1.18        1.09 -
 AC:      136.8  mm     G. Age:  19w 1d         26  %    FL/BPD:     74.2   %
 FL:       32.2  mm     G. Age:  20w 0d         54  %    FL/AC:      23.5   %    20 - 24
 HUM:      29.6  mm     G. Age:  19w 5d         52  %
 CER:      20.2  mm     G. Age:  19w 1d         40  %
 NFT:       2.7  mm

 LV:        5.3  mm
 CM:        4.7  mm

 Est. FW:     295  gm    0 lb 10 oz      43  %
OB History

 Gravidity:    4         Term:   1        Prem:   0        SAB:   1
 TOP:          0       Ectopic:  1        Living: 1
Gestational Age

 LMP:           19w 5d        Date:  07/03/18                 EDD:   04/09/19
 U/S Today:     19w 2d                                        EDD:   04/12/19
 Best:          19w 5d     Det. By:  LMP  (07/03/18)          EDD:   04/09/19
Anatomy

 Cranium:               Appears normal         LVOT:                   Appears normal
 Cavum:                 Appears normal         Aortic Arch:            Appears normal
 Ventricles:            Appears normal         Ductal Arch:            Appears normal
 Choroid Plexus:        Appears normal         Diaphragm:              Appears normal
 Cerebellum:            Appears normal         Stomach:                Appears normal, left
                                                                       sided
 Posterior Fossa:       Appears normal         Abdomen:                Appears normal
 Nuchal Fold:           Appears normal         Abdominal Wall:         Appears nml (cord
                                                                       insert, abd wall)
 Face:                  Appears normal         Cord Vessels:           Appears normal (3
                        (orbits and profile)                           vessel cord)
 Lips:                  Appears normal         Kidneys:                Appear normal
 Palate:                Appears normal         Bladder:                Appears normal
 Thoracic:              Appears normal         Spine:                  Appears normal
 Heart:                 Appears normal         Upper Extremities:      Appears normal
                        (4CH, axis, and
                        situs)
 RVOT:                  Appears normal         Lower Extremities:      Appears normal

 Other:  Normal genaitalia. Female gender. Nasal bone visualized. Heels and
         5th digit visualized.
Cervix Uterus Adnexa

 Cervix
 Length:           4.24  cm.
 Normal appearance by transabdominal scan.

 Left Ovary
 Within normal limits.

 Right Ovary
 Within normal limits.

 Cul De Sac
 No free fluid seen.

 Adnexa
 No abnormality visualized.
Impression

 We performed fetal anatomy scan. No makers of
 aneuploidies or fetal structural defects are seen. Fetal
 biometry is consistent with her previously-established dates.
 Amniotic fluid is normal and good fetal activity is seen.
 Placenta is posterior and there is no evidence of previa or
 accreta.

 On cell-free fetal DNA screening, the risks of fetal
 aneuploidies are not increased. MSAFP screening showed
 low risk for open-neural tube defects.
Recommendations

 Follow-up as clinically indicated.
                 Batts, Nupur
